# Patient Record
Sex: Male | Born: 1975 | Race: Black or African American | Hispanic: No | Marital: Married | Smoking: Current some day smoker
Health system: Southern US, Community
[De-identification: ages and names within clinical notes are randomized; demographics above are authoritative.]

## PROBLEM LIST (undated history)

## (undated) DIAGNOSIS — K219 Gastro-esophageal reflux disease without esophagitis: Secondary | ICD-10-CM

## (undated) DIAGNOSIS — N189 Chronic kidney disease, unspecified: Secondary | ICD-10-CM

## (undated) DIAGNOSIS — I10 Essential (primary) hypertension: Secondary | ICD-10-CM

## (undated) DIAGNOSIS — Z8639 Personal history of other endocrine, nutritional and metabolic disease: Secondary | ICD-10-CM

## (undated) DIAGNOSIS — Z923 Personal history of irradiation: Secondary | ICD-10-CM

## (undated) DIAGNOSIS — R011 Cardiac murmur, unspecified: Secondary | ICD-10-CM

## (undated) DIAGNOSIS — Z9189 Other specified personal risk factors, not elsewhere classified: Secondary | ICD-10-CM

## (undated) DIAGNOSIS — E079 Disorder of thyroid, unspecified: Secondary | ICD-10-CM

## (undated) DIAGNOSIS — N5089 Other specified disorders of the male genital organs: Secondary | ICD-10-CM

## (undated) HISTORY — DX: Essential (primary) hypertension: I10

## (undated) HISTORY — DX: Cardiac murmur, unspecified: R01.1

## (undated) HISTORY — DX: Disorder of thyroid, unspecified: E07.9

## (undated) HISTORY — PX: TONSILLECTOMY: SUR1361

## (undated) HISTORY — DX: Chronic kidney disease, unspecified: N18.9

---

## 1998-03-06 ENCOUNTER — Encounter: Admission: RE | Admit: 1998-03-06 | Discharge: 1998-03-06 | Payer: Self-pay | Admitting: Family Medicine

## 1998-05-25 ENCOUNTER — Encounter: Admission: RE | Admit: 1998-05-25 | Discharge: 1998-05-25 | Payer: Self-pay | Admitting: Family Medicine

## 1998-12-01 ENCOUNTER — Encounter: Admission: RE | Admit: 1998-12-01 | Discharge: 1998-12-01 | Payer: Self-pay | Admitting: Family Medicine

## 2000-02-11 ENCOUNTER — Encounter: Admission: RE | Admit: 2000-02-11 | Discharge: 2000-02-11 | Payer: Self-pay | Admitting: Sports Medicine

## 2000-12-30 ENCOUNTER — Encounter: Admission: RE | Admit: 2000-12-30 | Discharge: 2000-12-30 | Payer: Self-pay | Admitting: Sports Medicine

## 2001-01-28 ENCOUNTER — Encounter: Admission: RE | Admit: 2001-01-28 | Discharge: 2001-01-28 | Payer: Self-pay | Admitting: Family Medicine

## 2001-02-02 ENCOUNTER — Ambulatory Visit (HOSPITAL_COMMUNITY): Admission: RE | Admit: 2001-02-02 | Discharge: 2001-02-02 | Payer: Self-pay | Admitting: Family Medicine

## 2001-02-03 ENCOUNTER — Encounter: Payer: Self-pay | Admitting: Family Medicine

## 2001-03-09 ENCOUNTER — Encounter: Admission: RE | Admit: 2001-03-09 | Discharge: 2001-03-09 | Payer: Self-pay | Admitting: Family Medicine

## 2001-03-26 ENCOUNTER — Encounter: Admission: RE | Admit: 2001-03-26 | Discharge: 2001-03-26 | Payer: Self-pay | Admitting: Family Medicine

## 2001-04-22 ENCOUNTER — Encounter: Payer: Self-pay | Admitting: Family Medicine

## 2001-04-22 ENCOUNTER — Ambulatory Visit (HOSPITAL_COMMUNITY): Admission: RE | Admit: 2001-04-22 | Discharge: 2001-04-22 | Payer: Self-pay | Admitting: Family Medicine

## 2001-05-01 ENCOUNTER — Encounter: Admission: RE | Admit: 2001-05-01 | Discharge: 2001-05-01 | Payer: Self-pay | Admitting: Family Medicine

## 2001-06-03 ENCOUNTER — Encounter: Admission: RE | Admit: 2001-06-03 | Discharge: 2001-06-03 | Payer: Self-pay | Admitting: Family Medicine

## 2001-09-14 ENCOUNTER — Encounter: Admission: RE | Admit: 2001-09-14 | Discharge: 2001-09-14 | Payer: Self-pay | Admitting: Sports Medicine

## 2002-01-05 ENCOUNTER — Encounter: Admission: RE | Admit: 2002-01-05 | Discharge: 2002-01-05 | Payer: Self-pay | Admitting: Sports Medicine

## 2002-02-17 ENCOUNTER — Encounter: Admission: RE | Admit: 2002-02-17 | Discharge: 2002-02-17 | Payer: Self-pay | Admitting: Family Medicine

## 2002-03-31 ENCOUNTER — Encounter: Admission: RE | Admit: 2002-03-31 | Discharge: 2002-03-31 | Payer: Self-pay | Admitting: Family Medicine

## 2002-05-06 ENCOUNTER — Encounter: Admission: RE | Admit: 2002-05-06 | Discharge: 2002-05-06 | Payer: Self-pay | Admitting: Family Medicine

## 2002-05-17 ENCOUNTER — Encounter: Admission: RE | Admit: 2002-05-17 | Discharge: 2002-05-17 | Payer: Self-pay | Admitting: Sports Medicine

## 2002-05-24 ENCOUNTER — Encounter: Admission: RE | Admit: 2002-05-24 | Discharge: 2002-05-24 | Payer: Self-pay | Admitting: Family Medicine

## 2002-06-23 ENCOUNTER — Encounter: Admission: RE | Admit: 2002-06-23 | Discharge: 2002-06-23 | Payer: Self-pay | Admitting: Family Medicine

## 2002-12-03 ENCOUNTER — Encounter: Admission: RE | Admit: 2002-12-03 | Discharge: 2002-12-03 | Payer: Self-pay | Admitting: Family Medicine

## 2002-12-17 ENCOUNTER — Encounter: Admission: RE | Admit: 2002-12-17 | Discharge: 2002-12-17 | Payer: Self-pay | Admitting: Family Medicine

## 2003-09-28 ENCOUNTER — Encounter: Admission: RE | Admit: 2003-09-28 | Discharge: 2003-09-28 | Payer: Self-pay | Admitting: Family Medicine

## 2003-11-28 ENCOUNTER — Encounter: Admission: RE | Admit: 2003-11-28 | Discharge: 2003-11-28 | Payer: Self-pay | Admitting: Family Medicine

## 2005-03-13 ENCOUNTER — Emergency Department (HOSPITAL_COMMUNITY): Admission: EM | Admit: 2005-03-13 | Discharge: 2005-03-13 | Payer: Self-pay | Admitting: Emergency Medicine

## 2007-01-08 DIAGNOSIS — E0590 Thyrotoxicosis, unspecified without thyrotoxic crisis or storm: Secondary | ICD-10-CM | POA: Insufficient documentation

## 2007-01-08 DIAGNOSIS — I10 Essential (primary) hypertension: Secondary | ICD-10-CM | POA: Insufficient documentation

## 2011-11-12 HISTORY — PX: WISDOM TOOTH EXTRACTION: SHX21

## 2013-09-28 ENCOUNTER — Other Ambulatory Visit (HOSPITAL_COMMUNITY): Payer: Self-pay | Admitting: Urology

## 2013-09-28 DIAGNOSIS — N5082 Scrotal pain: Secondary | ICD-10-CM

## 2013-11-29 ENCOUNTER — Ambulatory Visit (HOSPITAL_COMMUNITY): Payer: 59

## 2014-03-21 ENCOUNTER — Other Ambulatory Visit (HOSPITAL_COMMUNITY): Payer: Self-pay | Admitting: Urology

## 2014-03-21 DIAGNOSIS — N5089 Other specified disorders of the male genital organs: Secondary | ICD-10-CM

## 2014-03-23 ENCOUNTER — Ambulatory Visit (HOSPITAL_COMMUNITY)
Admission: RE | Admit: 2014-03-23 | Discharge: 2014-03-23 | Disposition: A | Discharge status: Home / Self Care | Payer: Managed Care, Other (non HMO) | Source: Ambulatory Visit | Attending: Urology | Admitting: Urology

## 2014-03-23 DIAGNOSIS — I861 Scrotal varices: Secondary | ICD-10-CM | POA: Insufficient documentation

## 2014-03-23 DIAGNOSIS — N5089 Other specified disorders of the male genital organs: Secondary | ICD-10-CM

## 2014-04-05 ENCOUNTER — Other Ambulatory Visit: Payer: Self-pay | Admitting: Urology

## 2014-04-11 ENCOUNTER — Encounter (HOSPITAL_BASED_OUTPATIENT_CLINIC_OR_DEPARTMENT_OTHER): Payer: Self-pay | Admitting: *Deleted

## 2014-04-11 NOTE — Progress Notes (Signed)
NPO AFTER MN. ARRIVE AT 0515 , HEPARIN 2 HRS PRE-OP.  NEEDS ISTAT AND EKG. WILL TAKE NORVASC AM DOS W/ SIPS OF WATER.

## 2014-04-11 NOTE — Progress Notes (Signed)
04/11/14 1128  OBSTRUCTIVE SLEEP APNEA  Have you ever been diagnosed with sleep apnea through a sleep study? No  Do you snore loudly (loud enough to be heard through closed doors)?  1  Do you often feel tired, fatigued, or sleepy during the daytime? 0  Has anyone observed you stop breathing during your sleep? 0  Do you have, or are you being treated for high blood pressure? 1  BMI more than 35 kg/m2? 0  Age over 37 years old? 0  Neck circumference greater than 40 cm/16 inches? 1  Gender: 1  Obstructive Sleep Apnea Score 4  Score 4 or greater  Results sent to PCP

## 2014-04-14 NOTE — H&P (Signed)
Reason For Visit Follow-up for abnormal ultrasound   History of Present Illness 38 year old male who initially presented to me with complaint of a varicocele and low testosterone. His testosterone level was within normal limits. An ultrasound was ordered and showed an abnormal area within the lower pole nephrostomy testicle. This was not organized and not appear to be a tumor, however we did decide to repeat his ultrasound. His testicular cancer markers were negative at that time. He was temporarily lost to follow-up, but did have an ultrasound ultimately 2 weeks ago. He presents today for follow-up in this regard. No new complaints. No changes in his libido,erections, and improvement in his energy level.   Past Medical History Problems  1. History of hypertension (V12.59) 2. History of Hyperthyroidism (242.90)  Surgical History Problems  1. History of Tonsillectomy  Current Meds 1. AmLODIPine Besylate 10 MG Oral Tablet;  Therapy: (Recorded:26Aug2014) to Recorded  Allergies Medication  1. No Known Drug Allergies  Family History Problems  1. Family history of Breast Cancer (V16.3) : Paternal Grandmother 2. Family history of Family Health Status Number Of Children   1 son 2 daughters 3. No pertinent family history : Mother  Social History Problems  1. Alcohol Use   2 per day 2. Caffeine Use   rare 3. Marital History - Currently Married 4. Never A Smoker 5. Occupation:   Quarry managerconstruction inspector  Vitals Vital Signs [Data Includes: Last 1 Day]  Recorded: 26May2015 08:18AM  Height: 5 ft 9 in Weight: 214 lb  BMI Calculated: 31.6 BSA Calculated: 2.13 Blood Pressure: 143 / 87 Heart Rate: 51  Physical Exam left varicocele is present - unchanged  left testicle is atrophied slightly, I am unable to palpate the mass that is apparent on the u/s.  right testicle is normal appearing.   Results/Data Urine [Data Includes: Last 1 Day]   26May2015  COLOR YELLOW    APPEARANCE CLEAR   SPECIFIC GRAVITY 1.020   pH 6.0   GLUCOSE NEG mg/dL  BILIRUBIN NEG   KETONE NEG mg/dL  BLOOD NEG   PROTEIN NEG mg/dL  UROBILINOGEN 0.2 mg/dL  NITRITE NEG   LEUKOCYTE ESTERASE NEG    scrotal u/s: IMPRESSION:  1. Bilateral hydroceles. The area of palpation on the left may  correspond to this finding.  2. A poorly defined hypoechoic nodular region in the left testicle.  A small solid nodule cannot be excluded and urologic consultation is  recommended. These results will be called to the ordering clinician  or representative by the Radiologist Assistant, and communication  documented in the PACS Dashboard   Assessment Assessed  1. Testicular mass (608.89)  Plan Health Maintenance  1. UA With REFLEX; [Do Not Release]; Status:Complete;   Done: 26May2015 08:12AM Testicular mass  2. Follow-up Schedule Surgery Office  Follow-up  Status: Hold For - Appointment   Requested for: 26May2015 3. ALPHA-FETOPROTIEN (TUMOR MARKER); Status:In Progress - Specimen/Data  Collected;   Done: 26May2015 4. BETA HCG TUMOR MARKER; Status:In Progress - Specimen/Data Collected;   Done:  26May2015 5. LDH; Status:In Progress - Specimen/Data Collected;   Done: 26May2015 6. VENIPUNCTURE; Status:Complete;   Done: 26May2015  Discussion/Summary Left testicular ill-defined heterogeneous/hypoechoic area appears to be growing and is also more prominent. I went over the implications of this with the patient. Does appear to have increased in size over the last 5 months. It is unusual appearing, does not appear like a traditional malignancy, however, with the increase in size I think the safest thing is  to remove the left testicle. I have gone over the surgery including the risk/benefits and post-op expectations. I will repeat his tumor markers today and get him scheduled for radical orchiectomy ASAP.

## 2014-04-15 ENCOUNTER — Ambulatory Visit (HOSPITAL_BASED_OUTPATIENT_CLINIC_OR_DEPARTMENT_OTHER): Payer: Managed Care, Other (non HMO) | Admitting: Anesthesiology

## 2014-04-15 ENCOUNTER — Encounter (HOSPITAL_BASED_OUTPATIENT_CLINIC_OR_DEPARTMENT_OTHER): Payer: Managed Care, Other (non HMO) | Admitting: Anesthesiology

## 2014-04-15 ENCOUNTER — Encounter (HOSPITAL_BASED_OUTPATIENT_CLINIC_OR_DEPARTMENT_OTHER): Payer: Self-pay | Admitting: *Deleted

## 2014-04-15 ENCOUNTER — Encounter (HOSPITAL_BASED_OUTPATIENT_CLINIC_OR_DEPARTMENT_OTHER)
Admission: RE | Disposition: A | Discharge status: Home / Self Care | Payer: Self-pay | Source: Ambulatory Visit | Attending: Urology

## 2014-04-15 ENCOUNTER — Ambulatory Visit (HOSPITAL_BASED_OUTPATIENT_CLINIC_OR_DEPARTMENT_OTHER)
Admission: RE | Admit: 2014-04-15 | Discharge: 2014-04-15 | Disposition: A | Discharge status: Home / Self Care | Payer: Managed Care, Other (non HMO) | Source: Ambulatory Visit | Attending: Urology | Admitting: Urology

## 2014-04-15 DIAGNOSIS — N433 Hydrocele, unspecified: Secondary | ICD-10-CM | POA: Insufficient documentation

## 2014-04-15 DIAGNOSIS — E0590 Thyrotoxicosis, unspecified without thyrotoxic crisis or storm: Secondary | ICD-10-CM | POA: Insufficient documentation

## 2014-04-15 DIAGNOSIS — I861 Scrotal varices: Secondary | ICD-10-CM | POA: Insufficient documentation

## 2014-04-15 DIAGNOSIS — F17200 Nicotine dependence, unspecified, uncomplicated: Secondary | ICD-10-CM | POA: Insufficient documentation

## 2014-04-15 DIAGNOSIS — N508 Other specified disorders of male genital organs: Secondary | ICD-10-CM | POA: Insufficient documentation

## 2014-04-15 DIAGNOSIS — N5089 Other specified disorders of the male genital organs: Secondary | ICD-10-CM

## 2014-04-15 DIAGNOSIS — Z803 Family history of malignant neoplasm of breast: Secondary | ICD-10-CM | POA: Insufficient documentation

## 2014-04-15 DIAGNOSIS — I10 Essential (primary) hypertension: Secondary | ICD-10-CM | POA: Insufficient documentation

## 2014-04-15 HISTORY — DX: Other specified personal risk factors, not elsewhere classified: Z91.89

## 2014-04-15 HISTORY — PX: ORCHIECTOMY: SHX2116

## 2014-04-15 HISTORY — DX: Gastro-esophageal reflux disease without esophagitis: K21.9

## 2014-04-15 HISTORY — DX: Personal history of irradiation: Z92.3

## 2014-04-15 HISTORY — DX: Essential (primary) hypertension: I10

## 2014-04-15 HISTORY — DX: Other specified disorders of the male genital organs: N50.89

## 2014-04-15 HISTORY — DX: Personal history of other endocrine, nutritional and metabolic disease: Z86.39

## 2014-04-15 LAB — POCT I-STAT, CHEM 8
BUN: 8 mg/dL (ref 6–23)
CREATININE: 1 mg/dL (ref 0.50–1.35)
Calcium, Ion: 1.28 mmol/L — ABNORMAL HIGH (ref 1.12–1.23)
Chloride: 101 mEq/L (ref 96–112)
Glucose, Bld: 102 mg/dL — ABNORMAL HIGH (ref 70–99)
HCT: 48 % (ref 39.0–52.0)
HEMOGLOBIN: 16.3 g/dL (ref 13.0–17.0)
POTASSIUM: 3.6 meq/L — AB (ref 3.7–5.3)
SODIUM: 144 meq/L (ref 137–147)
TCO2: 25 mmol/L (ref 0–100)

## 2014-04-15 SURGERY — ORCHIECTOMY
Anesthesia: General | Site: Scrotum | Laterality: Left

## 2014-04-15 MED ORDER — LACTATED RINGERS IV SOLN
INTRAVENOUS | Status: DC
  Administered 2014-04-15 (×2): via INTRAVENOUS
  Filled 2014-04-15: qty 1000

## 2014-04-15 MED ORDER — MIDAZOLAM HCL 2 MG/2ML IJ SOLN
INTRAMUSCULAR | Status: AC
  Filled 2014-04-15: qty 2

## 2014-04-15 MED ORDER — 0.9 % SODIUM CHLORIDE (POUR BTL) OPTIME
TOPICAL | Status: DC | PRN
  Administered 2014-04-15: 500 mL

## 2014-04-15 MED ORDER — FENTANYL CITRATE 0.05 MG/ML IJ SOLN
INTRAMUSCULAR | Status: DC | PRN
  Administered 2014-04-15: 100 ug via INTRAVENOUS
  Administered 2014-04-15 (×2): 50 ug via INTRAVENOUS

## 2014-04-15 MED ORDER — ONDANSETRON HCL 4 MG/2ML IJ SOLN
INTRAMUSCULAR | Status: DC | PRN
  Administered 2014-04-15: 4 mg via INTRAVENOUS

## 2014-04-15 MED ORDER — BUPIVACAINE HCL (PF) 0.25 % IJ SOLN
INTRAMUSCULAR | Status: DC | PRN
  Administered 2014-04-15: 26 mL

## 2014-04-15 MED ORDER — HEPARIN SODIUM (PORCINE) 5000 UNIT/ML IJ SOLN
5000.0000 [IU] | Freq: Once | INTRAMUSCULAR | Status: AC
  Administered 2014-04-15: 5000 [IU] via SUBCUTANEOUS
  Filled 2014-04-15: qty 1

## 2014-04-15 MED ORDER — LIDOCAINE HCL (CARDIAC) 20 MG/ML IV SOLN
INTRAVENOUS | Status: DC | PRN
  Administered 2014-04-15: 100 mg via INTRAVENOUS

## 2014-04-15 MED ORDER — LACTATED RINGERS IV SOLN
INTRAVENOUS | Status: DC
  Filled 2014-04-15: qty 1000

## 2014-04-15 MED ORDER — DEXAMETHASONE SODIUM PHOSPHATE 4 MG/ML IJ SOLN
INTRAMUSCULAR | Status: DC | PRN
  Administered 2014-04-15: 10 mg via INTRAVENOUS

## 2014-04-15 MED ORDER — DOCUSATE SODIUM 100 MG PO CAPS: 100.0000 mg | ORAL_CAPSULE | Freq: Two times a day (BID) | ORAL | Status: DC | PRN

## 2014-04-15 MED ORDER — ACETAMINOPHEN 10 MG/ML IV SOLN
INTRAVENOUS | Status: DC | PRN
  Administered 2014-04-15: 1000 mg via INTRAVENOUS

## 2014-04-15 MED ORDER — PROMETHAZINE HCL 25 MG/ML IJ SOLN
6.2500 mg | INTRAMUSCULAR | Status: DC | PRN
  Filled 2014-04-15: qty 1

## 2014-04-15 MED ORDER — KETOROLAC TROMETHAMINE 30 MG/ML IJ SOLN
INTRAMUSCULAR | Status: DC | PRN
  Administered 2014-04-15: 30 mg via INTRAVENOUS

## 2014-04-15 MED ORDER — MEPERIDINE HCL 25 MG/ML IJ SOLN
6.2500 mg | INTRAMUSCULAR | Status: DC | PRN
  Filled 2014-04-15: qty 1

## 2014-04-15 MED ORDER — OXYCODONE HCL 5 MG PO TABS: 5.0000 mg | ORAL_TABLET | ORAL | Status: DC | PRN

## 2014-04-15 MED ORDER — GLYCOPYRROLATE 0.2 MG/ML IJ SOLN
INTRAMUSCULAR | Status: DC | PRN
  Administered 2014-04-15: 0.2 mg via INTRAVENOUS

## 2014-04-15 MED ORDER — FENTANYL CITRATE 0.05 MG/ML IJ SOLN
INTRAMUSCULAR | Status: AC
  Filled 2014-04-15: qty 4

## 2014-04-15 MED ORDER — MIDAZOLAM HCL 5 MG/5ML IJ SOLN
INTRAMUSCULAR | Status: DC | PRN
  Administered 2014-04-15: 2 mg via INTRAVENOUS

## 2014-04-15 MED ORDER — FENTANYL CITRATE 0.05 MG/ML IJ SOLN
25.0000 ug | INTRAMUSCULAR | Status: DC | PRN
  Filled 2014-04-15: qty 1

## 2014-04-15 MED ORDER — CEFAZOLIN SODIUM-DEXTROSE 2-3 GM-% IV SOLR
2.0000 g | Freq: Once | INTRAVENOUS | Status: AC
  Administered 2014-04-15: 2 g via INTRAVENOUS
  Filled 2014-04-15: qty 50

## 2014-04-15 MED ORDER — HEPARIN SODIUM (PORCINE) 5000 UNIT/ML IJ SOLN
INTRAMUSCULAR | Status: AC
  Filled 2014-04-15: qty 1

## 2014-04-15 MED ORDER — PROPOFOL 10 MG/ML IV BOLUS
INTRAVENOUS | Status: DC | PRN
  Administered 2014-04-15: 200 mg via INTRAVENOUS

## 2014-04-15 SURGICAL SUPPLY — 39 items
ADH SKN CLS APL DERMABOND .7 (GAUZE/BANDAGES/DRESSINGS) ×1
BLADE SURG 15 STRL LF DISP TIS (BLADE) ×1 IMPLANT
BLADE SURG 15 STRL SS (BLADE) ×3
BNDG GAUZE ELAST 4 BULKY (GAUZE/BANDAGES/DRESSINGS) ×3 IMPLANT
CANISTER SUCTION 2500CC (MISCELLANEOUS) ×3 IMPLANT
COVER MAYO STAND STRL (DRAPES) ×3 IMPLANT
COVER TABLE BACK 60X90 (DRAPES) ×3 IMPLANT
DERMABOND ADVANCED (GAUZE/BANDAGES/DRESSINGS) ×2
DERMABOND ADVANCED .7 DNX12 (GAUZE/BANDAGES/DRESSINGS) ×1 IMPLANT
DISSECTOR ROUND CHERRY 3/8 STR (MISCELLANEOUS) ×3 IMPLANT
DRAIN PENROSE 18X1/4 LTX STRL (WOUND CARE) ×2 IMPLANT
DRAPE PED LAPAROTOMY (DRAPES) ×3 IMPLANT
ELECT REM PT RETURN 9FT ADLT (ELECTROSURGICAL) ×3
ELECTRODE REM PT RTRN 9FT ADLT (ELECTROSURGICAL) ×1 IMPLANT
GLOVE BIOGEL M STER SZ 6 (GLOVE) ×3 IMPLANT
GLOVE BIOGEL M STRL SZ7.5 (GLOVE) ×2 IMPLANT
GLOVE INDICATOR 6.5 STRL GRN (GLOVE) ×3 IMPLANT
GOWN STRL REUS W/TWL LRG LVL3 (GOWN DISPOSABLE) ×2 IMPLANT
GOWN STRL REUS W/TWL XL LVL3 (GOWN DISPOSABLE) ×3 IMPLANT
NEEDLE HYPO 22GX1.5 SAFETY (NEEDLE) ×3 IMPLANT
NS IRRIG 500ML POUR BTL (IV SOLUTION) ×3 IMPLANT
PACK BASIN DAY SURGERY FS (CUSTOM PROCEDURE TRAY) ×3 IMPLANT
PENCIL BUTTON HOLSTER BLD 10FT (ELECTRODE) ×3 IMPLANT
SPONGE LAP 4X18 X RAY DECT (DISPOSABLE) ×2 IMPLANT
SUPPORT SCROTAL LG STRP (MISCELLANEOUS) ×1 IMPLANT
SUPPORTER ATHLETIC LG (MISCELLANEOUS) ×1
SUT PROLENE 2 0 SH DA (SUTURE) ×2 IMPLANT
SUT SILK 2 0 SH (SUTURE) ×3 IMPLANT
SUT VIC AB 3-0 SH 27 (SUTURE) ×3
SUT VIC AB 3-0 SH 27X BRD (SUTURE) ×1 IMPLANT
SUT VICRYL AB 2 0 TIE (SUTURE) IMPLANT
SUT VICRYL AB 2 0 TIES (SUTURE) ×3
SYR BULB IRRIGATION 50ML (SYRINGE) ×3 IMPLANT
SYR CONTROL 10ML LL (SYRINGE) ×2 IMPLANT
TOWEL OR 17X24 6PK STRL BLUE (TOWEL DISPOSABLE) ×6 IMPLANT
TRAY DSU PREP LF (CUSTOM PROCEDURE TRAY) ×3 IMPLANT
TUBE CONNECTING 12'X1/4 (SUCTIONS) ×1
TUBE CONNECTING 12X1/4 (SUCTIONS) ×2 IMPLANT
YANKAUER SUCT BULB TIP NO VENT (SUCTIONS) ×3 IMPLANT

## 2014-04-15 NOTE — Anesthesia Preprocedure Evaluation (Addendum)
Anesthesia Evaluation  Patient identified by MRN, date of birth, ID band Patient awake    Reviewed: Allergy & Precautions, H&P , NPO status , Patient's Chart, lab work & pertinent test results  Airway Mallampati: II TM Distance: >3 FB Neck ROM: Full    Dental no notable dental hx.    Pulmonary neg pulmonary ROS, Current Smoker,  breath sounds clear to auscultation  Pulmonary exam normal       Cardiovascular hypertension, Pt. on medications Rhythm:Regular Rate:Normal     Neuro/Psych negative neurological ROS  negative psych ROS   GI/Hepatic negative GI ROS, Neg liver ROS,   Endo/Other  negative endocrine ROS  Renal/GU negative Renal ROS  negative genitourinary   Musculoskeletal negative musculoskeletal ROS (+)   Abdominal   Peds negative pediatric ROS (+)  Hematology negative hematology ROS (+)   Anesthesia Other Findings   Reproductive/Obstetrics negative OB ROS                          Anesthesia Physical Anesthesia Plan  ASA: II  Anesthesia Plan: General   Post-op Pain Management:    Induction: Intravenous  Airway Management Planned: LMA  Additional Equipment:   Intra-op Plan:   Post-operative Plan: Extubation in OR  Informed Consent: I have reviewed the patients History and Physical, chart, labs and discussed the procedure including the risks, benefits and alternatives for the proposed anesthesia with the patient or authorized representative who has indicated his/her understanding and acceptance.   Dental advisory given  Plan Discussed with: CRNA  Anesthesia Plan Comments:         Anesthesia Quick Evaluation

## 2014-04-15 NOTE — Op Note (Signed)
Preoperative diagnosis:  1. Left testicular mass  Postoperative diagnosis:   Same  Procedure: 1. Left radical orchiectomy  Surgeon: Crist Fat, MD  Anesthesia: General  Complications: None  Intraoperative findings: Normal anatomy, mass was not palpable.  EBL: Minimal  Specimens: Left spermatic cord and testicle  Indication: Eugene Knight is a 38 y.o. patient with an abnormal scrotal ultrasound which we have been following for several months.  The most recent ultrasound revealed a more discrete mass in the lower pole of the left testicle. Given the concern that this might be a malignancy, I recommended removal of the testicle. After reviewing the management options for treatment, he elected to proceed with the above surgical procedure(s). We have discussed the potential benefits and risks of the procedure, side effects of the proposed treatment, the likelihood of the patient achieving the goals of the procedure, and any potential problems that might occur during the procedure or recuperation. Informed consent has been obtained.  Description of procedure:  The patient was taken to the operating room and general anesthesia was induced.  An LMA was inserted and the patient was prepped and draped in the routine sterile fashion. A timeout was then held with confirmation of antibiotics infusion was performed.  An approximately 3cm incision was then made above the external inguinal ring in the left groin. The incision was then carried down to the external oblique aponeurosis using Bovie cautery. A wheat Lander retractor was then placed within the incision and the assistant used a Army-Navy to help retract distally. The external ring was then opened proximally approximately 1 cm. The spermatic cord was then isolated at the level of the pubic tubercle and surrounded digitally using a Kitner to ensure that all components of the cord were included. I then was able to pass a quarter-inch  Penrose drain underneath the spermatic cord which was and snapped and used for retraction throughout the case. The spermatic cord was then dissected distally into the scrotal hiatus, and then the testicle was pushed from below and brought out into the incision. The gubernacular attachments were then detached from the scrotal wall using electrocautery Bovie. Once the cord and testicle were free the cord was dissected proximally to the internal inguinal ring where it was separated into the vas deferens and the spermatic vessels. Then using snaps the cord was then snapped and divided with Metzenbaum scissors. The vessels were then suture ligated using a 2-0 silk tie. The vas deferens was tied off using a 2-0 Prolene and the end of the suture left long. Once the specimen had been removed the wound bed was copiously irrigated. Hemostasis was noted to be excellent. The external oblique aponeurosis was then reapproximated using a 3-0 Vicryl suture. The deep dermal layer was then approximated using a 3-0 Vicryl in interrupted sutures. The skin was then closed with 4-0 Monocryl in a subcuticular running fashion. The incision was then infiltrated with 20 cc of quarter percent plain Marcaine. Dermabond was then applied. At the end of the case all laps needles and sponges had been accounted for. The patient was subsequently extubated and returned to the PACU in stable condition.  Disposition: The patient is been scheduled for followup in 2 weeks.   Crist Fat, M.D.

## 2014-04-15 NOTE — Anesthesia Procedure Notes (Addendum)
Procedure Name: LMA Insertion Date/Time: 04/15/2014 7:35 AM Performed by: Norva Pavlov Pre-anesthesia Checklist: Patient identified, Emergency Drugs available, Suction available and Patient being monitored Patient Re-evaluated:Patient Re-evaluated prior to inductionOxygen Delivery Method: Circle System Utilized Preoxygenation: Pre-oxygenation with 100% oxygen Intubation Type: IV induction Ventilation: Mask ventilation without difficulty LMA: LMA inserted LMA Size: 5.0 Number of attempts: 1 Airway Equipment and Method: bite block Placement Confirmation: positive ETCO2 Tube secured with: Tape Dental Injury: Teeth and Oropharynx as per pre-operative assessment

## 2014-04-15 NOTE — Discharge Instructions (Signed)
Orchiectomy: POST OP INSTRUCTIONS  1. DIET: Follow a light bland diet the first 24 hours after arrival home, such as soup, liquids, crackers, etc.  Be sure to include lots of fluids daily.  Avoid fast food or heavy meals as your are more likely to get nauseated.  Eat a low fat the next few days after surgery. 2. Take your usually prescribed home medications unless otherwise directed. 3. PAIN CONTROL: a. Pain is best controlled by a usual combination of three different methods TOGETHER: i. Ice/Heat ii. Over the counter pain medication iii. Prescription pain medication b. Most patients will experience some swelling and bruising around the incision.  Ice packs or heating pads (30-60 minutes up to 6 times a day) will help. Use ice for the first few days to help decrease swelling and bruising, then switch to heat to help relax tight/sore spots and speed recovery.  Some people prefer to use ice alone, heat alone, alternating between ice & heat.  Experiment to what works for you.  Swelling and bruising can take several Shaddock to resolve.   c. It is helpful to take an over-the-counter pain medication regularly for the first few Schriver.  Choose one of the following that works best for you: i. Naproxen (Aleve, etc)  Two 220mg  tabs twice a day ii. Ibuprofen (Advil, etc) Three 200mg  tabs four times a day (every meal & bedtime) iii. Acetaminophen (Tylenol, etc) 325-650mg  four times a day (every meal & bedtime) d. A  prescription for pain medication should be given to you upon discharge.  Take your pain medication as prescribed.  i. If you are having problems/concerns with the prescription medicine (does not control pain, nausea, vomiting, rash, itching, etc), please call us [phone] to see if we need to switch you to a different pain medicine that will work better for you and/or control your side effect better. 4. If you need a refill on your pain medication, please contactus. 5. Avoid getting constipated.   Between the surgery and the pain medications, it is common to experience some constipation.  Increasing fluid intake and taking a fiber supplement (such as Metamucil, Citrucel, FiberCon, MiraLax, etc) 1-2 times a day regularly will usually help prevent this problem from occurring.  A mild laxative (prune juice, Milk of Magnesia, MiraLax, etc) should be taken according to package directions if there are no bowel movements after 48 hours.   6. Wash / shower every day.  You may shower over the dressings as they are waterproof.   7. Remove your waterproof bandages 5 days after surgery.  You may leave the incision open to air.  You may replace a dressing/Band-Aid to cover the incision for comfort if you wish.  Continue to shower over incision(s) after the dressing is off. 8. ACTIVITIES as tolerated:   a. You may resume regular (light) daily activities beginning the next day--such as daily self-care, walking, climbing stairs--gradually increasing activities as tolerated.  If you can walk 30 minutes without difficulty, it is safe to try more intense activity such as jogging, treadmill, bicycling, low-impact aerobics, swimming, etc. b. Save the most intensive and strenuous activity for last such as sit-ups, heavy lifting, contact sports, etc  Refrain from any heavy lifting or straining until you are off narcotics for pain control.   c. DO NOT PUSH THROUGH PAIN.  Let pain be your guide: If it hurts to do something, don't do it.  Pain is your body warning you to avoid that activity for another  week until the pain goes down. d. You may drive when you are no longer taking prescription pain medication, you can comfortably wear a seatbelt, and you can safely maneuver your car and apply brakes. e. Bonita Quin may have sexual intercourse when it is comfortable.  9. FOLLOW UP in our office a. Please call [facility] at [phone] to set up an appointment to see your surgeon in the office for a follow-up appointment  approximately 2-3 weeks after your surgery, if you have not been already scheduled for follow-up. b. Make sure that you call for this appointment the day you arrive home to insure a convenient appointment time. 9.  IF YOU HAVE DISABILITY OR FAMILY LEAVE FORMS, BRING THEM TO THE OFFICE FOR PROCESSING.  DO NOT GIVE THEM TO YOUR DOCTOR.  WHEN TO CALL us [phone]: 1. Poor pain control 2. Reactions / problems with new medications (rash/itching, nausea, etc)  3. Fever over 101.5 F (38.5 C) 4. Inability to urinate 5. Nausea and/or vomiting 6. Worsening swelling or bruising 7. Continued bleeding from incision. 8. Increased pain, redness, or drainage from the incision   The clinic staff is available to answer your questions during regular business hours (8:30am-5pm).  Please dont hesitate to call and ask to speak to one of our nurses for clinical concerns.   If you have a medical emergency, go to the nearest emergency room or call 911.    Post Anesthesia Home Care Instructions  Activity: Get plenty of rest for the remainder of the day. A responsible adult should stay with you for 24 hours following the procedure.  For the next 24 hours, DO NOT: -Drive a car -Advertising copywriter -Drink alcoholic beverages -Take any medication unless instructed by your physician -Make any legal decisions or sign important papers.  Meals: Start with liquid foods such as gelatin or soup. Progress to regular foods as tolerated. Avoid greasy, spicy, heavy foods. If nausea and/or vomiting occur, drink only clear liquids until the nausea and/or vomiting subsides. Call your physician if vomiting continues.  Special Instructions/Symptoms: Your throat may feel dry or sore from the anesthesia or the breathing tube placed in your throat during surgery. If this causes discomfort, gargle with warm salt water. The discomfort should disappear within 24 hours.

## 2014-04-15 NOTE — Anesthesia Postprocedure Evaluation (Signed)
  Anesthesia Post-op Note  Patient: Eugene Knight  Procedure(s) Performed: Procedure(s) (LRB): ORCHIECTOMY (Left)  Patient Location: PACU  Anesthesia Type: General  Level of Consciousness: awake and alert   Airway and Oxygen Therapy: Patient Spontanous Breathing  Post-op Pain: mild  Post-op Assessment: Post-op Vital signs reviewed, Patient's Cardiovascular Status Stable, Respiratory Function Stable, Patent Airway and No signs of Nausea or vomiting  Last Vitals:  Filed Vitals:   04/15/14 1105  BP: 135/80  Pulse:   Temp: 36.1 C  Resp: 18    Post-op Vital Signs: stable   Complications: No apparent anesthesia complications

## 2014-04-15 NOTE — Transfer of Care (Signed)
Immediate Anesthesia Transfer of Care Note  Patient: Eugene Knight  Procedure(s) Performed: Procedure(s) (LRB): ORCHIECTOMY (Left)  Patient Location: PACU  Anesthesia Type: General  Level of Consciousness: awake, alert  and oriented  Airway & Oxygen Therapy: Patient Spontanous Breathing and Patient connected to face mask oxygen  Post-op Assessment: Report given to PACU RN and Post -op Vital signs reviewed and stable  Post vital signs: Reviewed and stable  Complications: No apparent anesthesia complications

## 2014-04-20 ENCOUNTER — Encounter (HOSPITAL_BASED_OUTPATIENT_CLINIC_OR_DEPARTMENT_OTHER): Payer: Self-pay | Admitting: Urology

## 2015-11-09 IMAGING — US US SCROTUM
1 series · 13 of 25 positions shown · non-contrast
Comparison: None.

CLINICAL DATA: left lower pole abnormality

EXAM:
ULTRASOUND OF SCROTUM
TECHNIQUE: Complete ultrasound examination of the testicles, epididymis, and
other scrotal structures was performed.

[Series 1: us scrotum · 0.06mm/px · 13 of 45 slices shown]
[im 1/45]
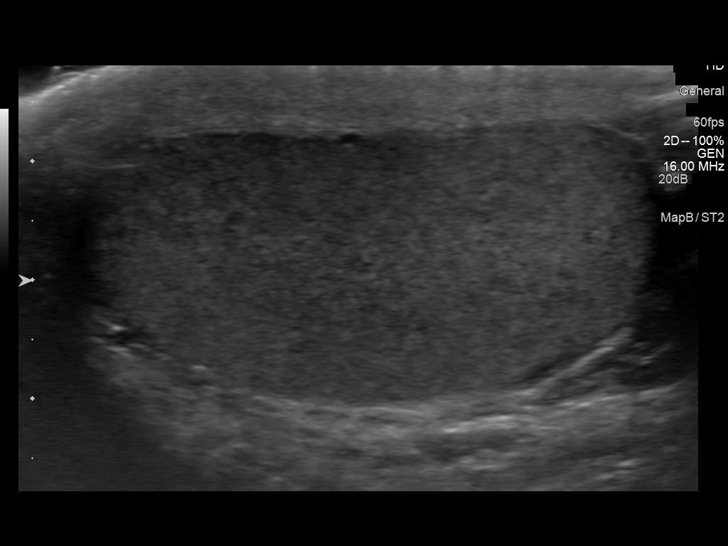
[im 4/45]
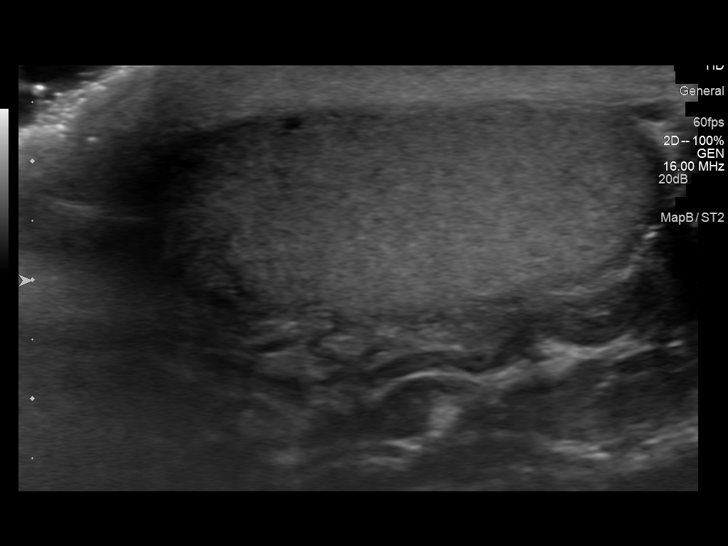
[im 8/45]
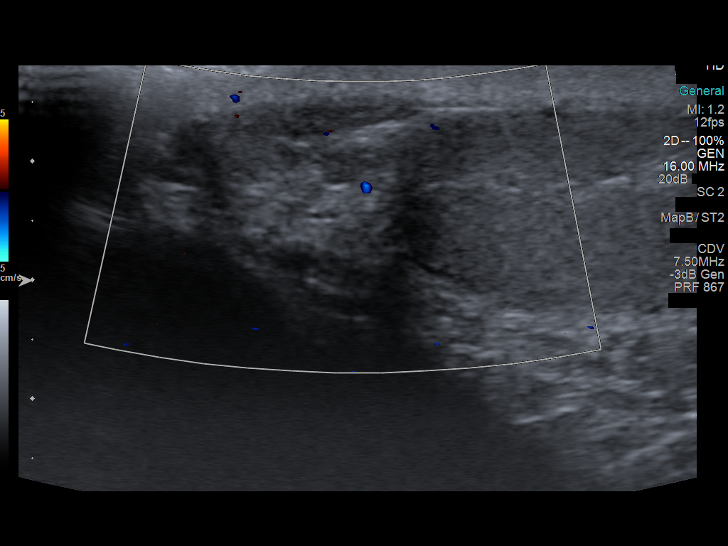
[im 12/45]
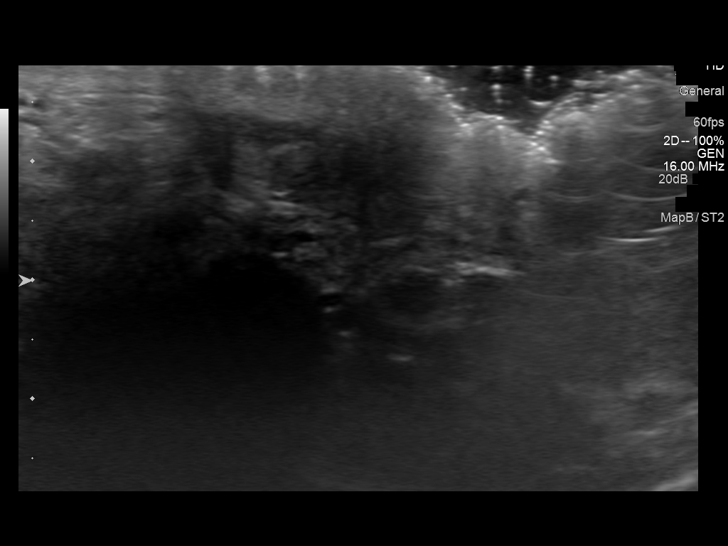
[im 15/45]
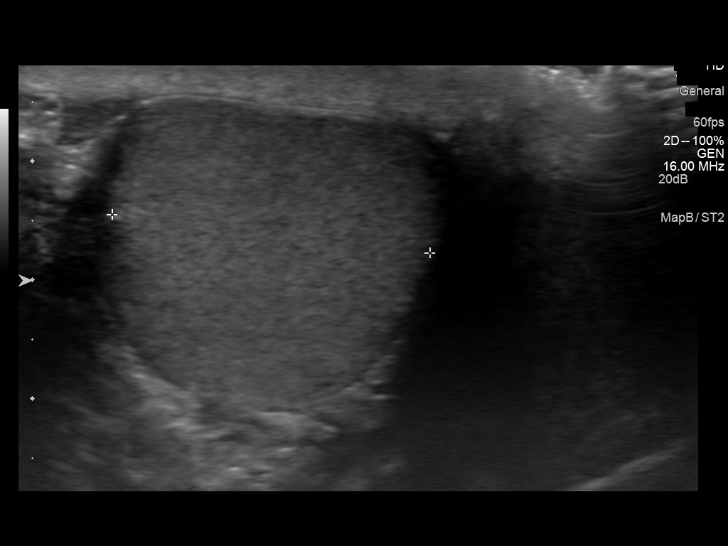
[im 19/45]
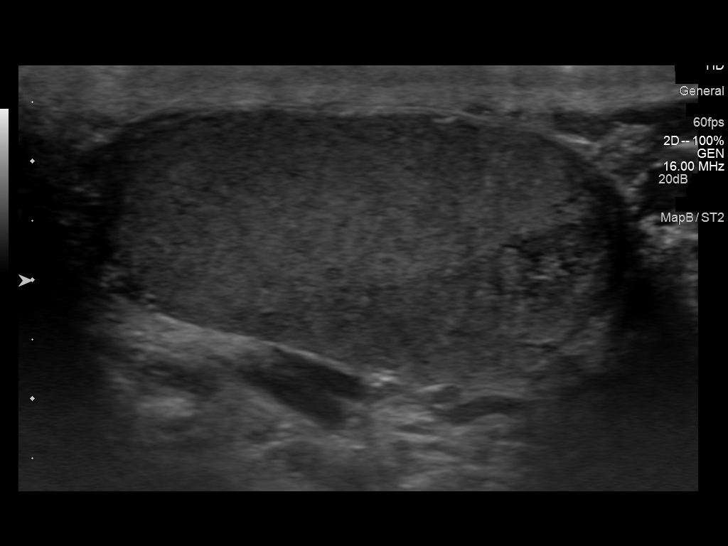
[im 23/45]
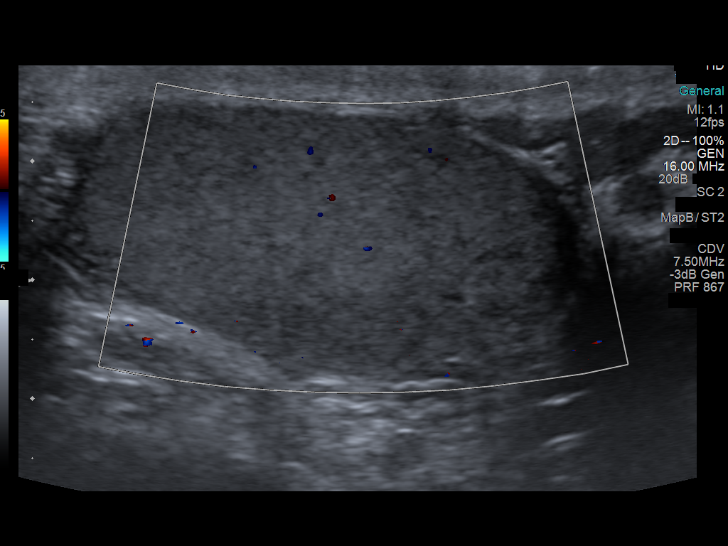
[im 26/45]
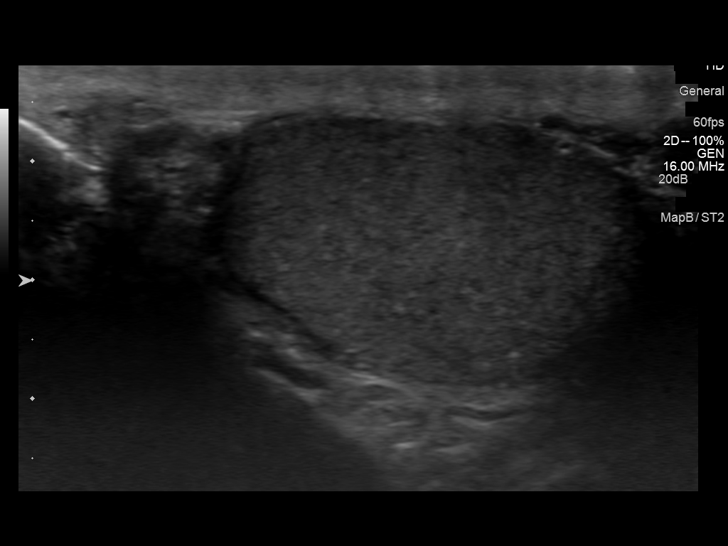
[im 30/45]
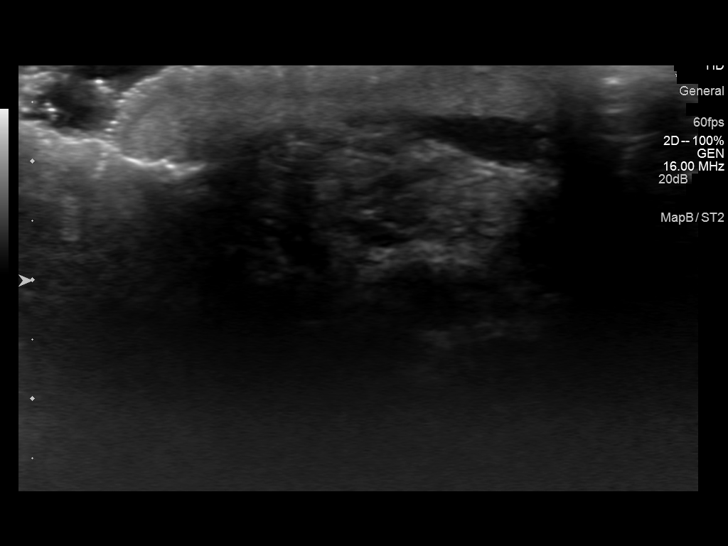
[im 34/45]
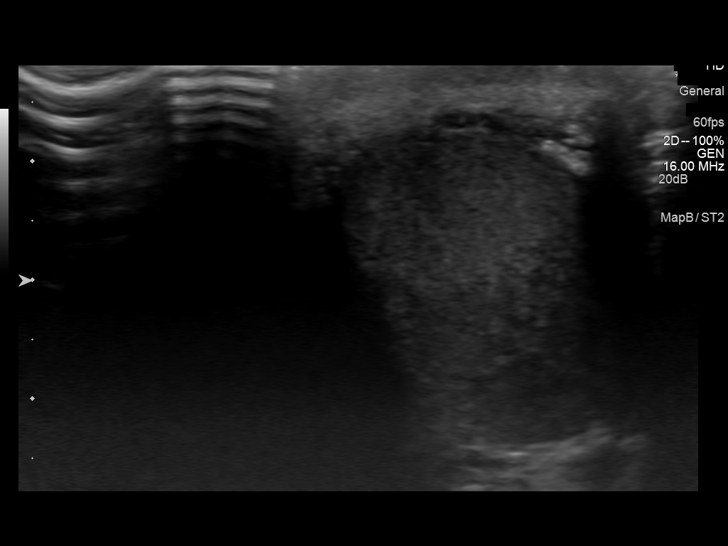
[im 37/45]
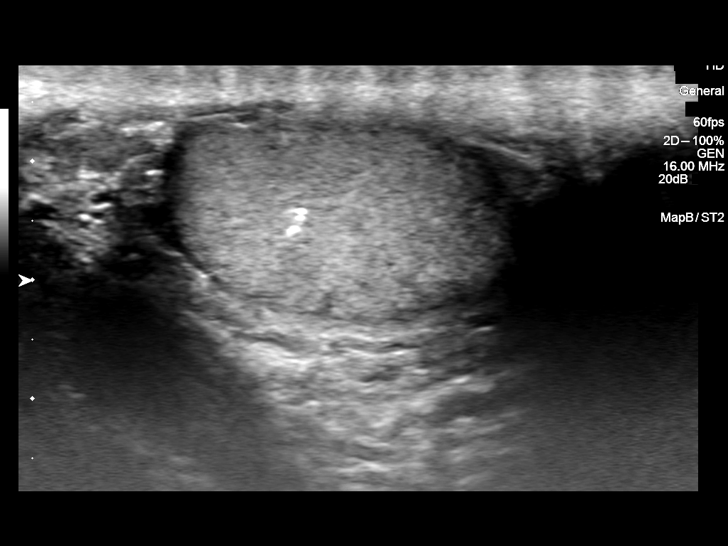
[im 41/45]
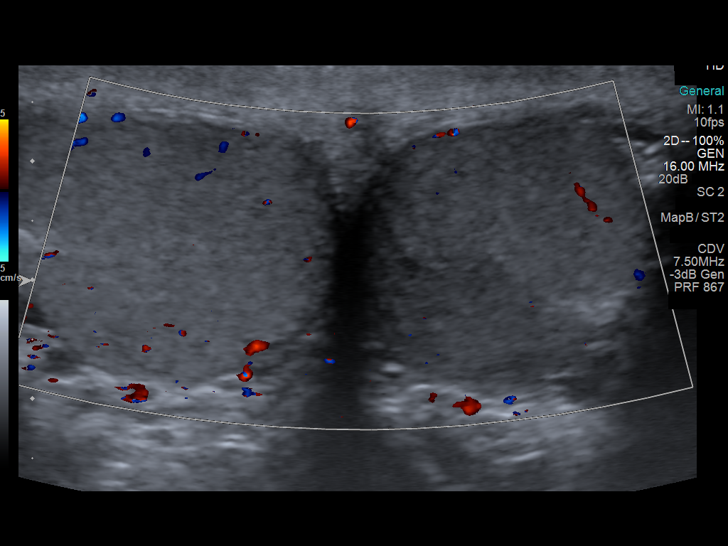
[im 45/45]
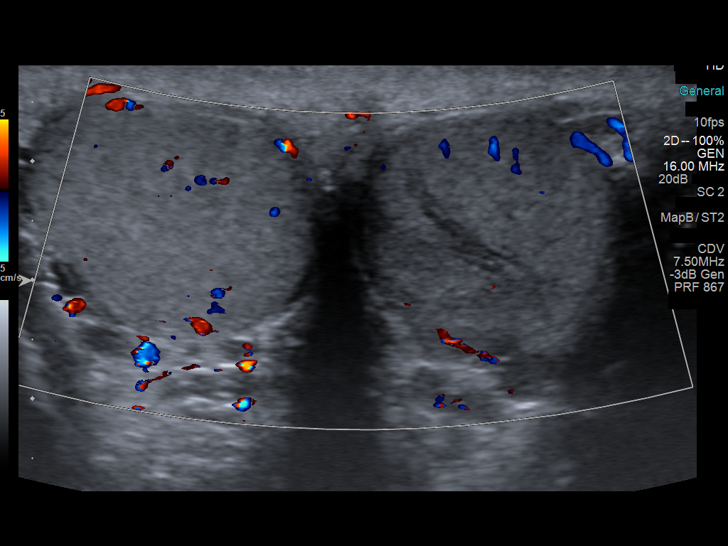

[13 of 25 positions shown; findings below may reference images not displayed]

FINDINGS: Right testicle

Measurements: 4.6 x 2.3 x 2.7 cm. No mass appreciated. A benign
appearing focus of microlithiasis is identified.

Left testicle

Measurements: 4.2 x 2.2 x 2.6 cm. A focal irregular bordered
hypoechoic region within the inferior border of the left testicle.
This area grossly measures 2.1 x 1.2 cm. A focal area of
benign-appearing microlithiasis is also appreciated.

Right epididymis:  Normal in size and appearance.

Left epididymis:  Normal in size and appearance.

Hydrocele:  None visualized.

Varicocele:  Bilateral varicoceles superiorly and inferiorly.
IMPRESSION: 1. Bilateral hydroceles. The area of palpation on the left may
correspond to this finding.
2. A poorly defined hypoechoic nodular region in the left testicle.
A small solid nodule cannot be excluded and urologic consultation is
recommended. These results will be called to the ordering clinician
or representative by the Radiologist Assistant, and communication
documented in the PACS Dashboard.

## 2020-10-23 DIAGNOSIS — M205X1 Other deformities of toe(s) (acquired), right foot: Secondary | ICD-10-CM | POA: Insufficient documentation

## 2020-10-23 DIAGNOSIS — L84 Corns and callosities: Secondary | ICD-10-CM | POA: Insufficient documentation

## 2022-04-16 ENCOUNTER — Ambulatory Visit (HOSPITAL_BASED_OUTPATIENT_CLINIC_OR_DEPARTMENT_OTHER): Payer: Self-pay | Admitting: Nurse Practitioner

## 2022-05-06 DIAGNOSIS — M2141 Flat foot [pes planus] (acquired), right foot: Secondary | ICD-10-CM | POA: Insufficient documentation

## 2022-05-21 ENCOUNTER — Encounter (HOSPITAL_BASED_OUTPATIENT_CLINIC_OR_DEPARTMENT_OTHER): Payer: Self-pay | Admitting: Family Medicine

## 2022-05-21 ENCOUNTER — Ambulatory Visit (INDEPENDENT_AMBULATORY_CARE_PROVIDER_SITE_OTHER): Payer: Managed Care, Other (non HMO) | Admitting: Family Medicine

## 2022-05-21 VITALS — BP 169/114 | HR 60 | Temp 97.6°F | Ht 69.0 in | Wt 249.8 lb

## 2022-05-21 DIAGNOSIS — M76822 Posterior tibial tendinitis, left leg: Secondary | ICD-10-CM

## 2022-05-21 DIAGNOSIS — M25572 Pain in left ankle and joints of left foot: Secondary | ICD-10-CM | POA: Insufficient documentation

## 2022-05-21 NOTE — Progress Notes (Signed)
    Procedures performed today:    None.  Independent interpretation of notes and tests performed by another provider:   None.  Brief History, Exam, Impression, and Recommendations:    BP (!) 169/114   Pulse 60   Temp 97.6 F (36.4 C) (Oral)   Ht 5\' 9"  (1.753 m)   Wt 249 lb 12.8 oz (113.3 kg)   SpO2 98%   BMI 36.89 kg/m   Left ankle pain Pain mostly over anterior and medial ankle. Has been present for about 2 months, no specific injury, does not recall any inciting event. Pain worse with certain activities, walking sometimes, but will have some days without symptoms. Has had some swelling. Has done some icing/heat, no significant benefit with this. Tried home ankle brace/support. Did see podiatry, had X-rays of feet which were unrevealing, was also prescribed meloxicam he thinks. Thinks he may have had some ankle sprains in the past, no fractures or more severe injuries recalled. On exam, patient does have some tenderness to palpation along posterior tibial tendon beginning just proximal to medial malleolus and extending inferiorly down to site of insertion.  To a lesser degree, there is some tenderness over anterior ankle, predominantly over tendon of tibialis anterior.  He does have normal active and passive range of motion of left ankle.  Good strength with plantarflexion, dorsiflexion, eversion and inversion.  Negative anterior drawer.  On visual inspection with weightbearing, he does have notable pes planus, able to perform bilateral calf raises without issue.  With single calf raise on left, he does have some hesitancy, some pain associated with this. I feel that current symptoms are most likely related to tendinitis of posterior tibial tendon.  I feel that this is likely related to underlying notable pes planus which is also further exacerbating symptoms for him currently.  Given symptoms and likely etiology, would recommend topical therapies such as icing, use of OTC medications as  needed or if using meloxicam would avoid OTC NSAIDs.  Orthotics which have been discussed previously during his podiatry evaluation would likely be of benefit for patient as they can reduce some of the stress across the posterior tibial tendon by providing some arch support Also feel that working with physical therapy would be reasonable as they can work on soft tissue mobilization, stretching and strengthening at site of discomfort and along posterior tibial tendon.  Referral placed today Would recommend following up in about 6 to 8 weeks to monitor progress or sooner as needed.  Patient elected to call back as needed over this timeframe.  Blood pressure is elevated in office today.  Recommend that he follow-up with his primary care doctor for further monitoring and discussion of any recommended interventions.  Return in about 8 weeks (around 07/16/2022) for Left ankle pain.   ___________________________________________ Kahron Kauth de 09/15/2022, MD, ABFM, [facility]

## 2022-05-24 NOTE — Assessment & Plan Note (Addendum)
Pain mostly over anterior and medial ankle. Has been present for about 2 months, no specific injury, does not recall any inciting event. Pain worse with certain activities, walking sometimes, but will have some days without symptoms. Has had some swelling. Has done some icing/heat, no significant benefit with this. Tried home ankle brace/support. Did see podiatry, had X-rays of feet which were unrevealing, was also prescribed meloxicam he thinks. Thinks he may have had some ankle sprains in the past, no fractures or more severe injuries recalled. On exam, patient does have some tenderness to palpation along posterior tibial tendon beginning just proximal to medial malleolus and extending inferiorly down to site of insertion.  To a lesser degree, there is some tenderness over anterior ankle, predominantly over tendon of tibialis anterior.  He does have normal active and passive range of motion of left ankle.  Good strength with plantarflexion, dorsiflexion, eversion and inversion.  Negative anterior drawer.  On visual inspection with weightbearing, he does have notable pes planus, able to perform bilateral calf raises without issue.  With single calf raise on left, he does have some hesitancy, some pain associated with this. I feel that current symptoms are most likely related to tendinitis of posterior tibial tendon.  I feel that this is likely related to underlying notable pes planus which is also further exacerbating symptoms for him currently.  Given symptoms and likely etiology, would recommend topical therapies such as icing, use of OTC medications as needed or if using meloxicam would avoid OTC NSAIDs.  Orthotics which have been discussed previously during his podiatry evaluation would likely be of benefit for patient as they can reduce some of the stress across the posterior tibial tendon by providing some arch support Also feel that working with physical therapy would be reasonable as they can work on  soft tissue mobilization, stretching and strengthening at site of discomfort and along posterior tibial tendon.  Referral placed today Would recommend following up in about 6 to 8 weeks to monitor progress or sooner as needed.

## 2022-07-16 ENCOUNTER — Ambulatory Visit (HOSPITAL_BASED_OUTPATIENT_CLINIC_OR_DEPARTMENT_OTHER): Payer: Managed Care, Other (non HMO) | Admitting: Family Medicine

## 2023-12-23 ENCOUNTER — Encounter (HOSPITAL_BASED_OUTPATIENT_CLINIC_OR_DEPARTMENT_OTHER): Payer: Self-pay | Admitting: Family Medicine

## 2023-12-23 ENCOUNTER — Ambulatory Visit (HOSPITAL_BASED_OUTPATIENT_CLINIC_OR_DEPARTMENT_OTHER): Payer: Managed Care, Other (non HMO) | Admitting: Family Medicine

## 2023-12-23 VITALS — BP 171/100 | HR 60 | Ht 68.0 in | Wt 245.4 lb

## 2023-12-23 DIAGNOSIS — I10 Essential (primary) hypertension: Secondary | ICD-10-CM | POA: Diagnosis not present

## 2023-12-23 DIAGNOSIS — J309 Allergic rhinitis, unspecified: Secondary | ICD-10-CM | POA: Insufficient documentation

## 2023-12-23 DIAGNOSIS — Z1211 Encounter for screening for malignant neoplasm of colon: Secondary | ICD-10-CM

## 2023-12-23 DIAGNOSIS — E7800 Pure hypercholesterolemia, unspecified: Secondary | ICD-10-CM | POA: Insufficient documentation

## 2023-12-23 DIAGNOSIS — R0683 Snoring: Secondary | ICD-10-CM | POA: Insufficient documentation

## 2023-12-23 MED ORDER — VALSARTAN 80 MG PO TABS: 80.0000 mg | ORAL_TABLET | Freq: Every day | ORAL | 1 refills | Status: DC

## 2023-12-23 NOTE — Addendum Note (Signed)
Addended by: Carleene Mains A on: 12/23/2023 02:59 PM   Modules accepted: Orders

## 2023-12-23 NOTE — Patient Instructions (Signed)
  Medication Instructions:  Your physician recommends that you continue on your current medications as directed. Please refer to the Current Medication list given to you today. --If you need a refill on any your medications before your next appointment, please call your pharmacy first. If no refills are authorized on file call the office.-- Lab Work: Your physician has recommended that you have lab work today: 2 weeks from today If you have labs (blood work) drawn today and your tests are completely normal, you will receive your results via MyChart message OR a phone call from our staff.  Please ensure you check your voicemail in the event that you authorized detailed messages to be left on a delegated number. If you have any lab test that is abnormal or we need to change your treatment, we will call you to review the results.  Follow-Up: Your next appointment:   Your physician recommends that you schedule a follow-up appointment in: 4 weeks (around 01/20/2024) for hypertension, med check  with Dr. de Peru  You will receive a text message or e-mail with a link to a survey about your care and experience with [place] today! We would greatly appreciate your feedback!   Thanks for letting us be apart of your health journey!!  Primary Care and Sports Medicine   Dr. Ceasar Mons Peru   We encourage you to activate your patient portal called "MyChart".  Sign up information is provided on this After Visit Summary.  MyChart is used to connect with patients for Virtual Visits (Telemedicine).  Patients are able to view lab/test results, encounter notes, upcoming appointments, etc.  Non-urgent messages can be sent to your provider as well. To learn more about what you can do with MyChart, please visit --  [email or web address].

## 2023-12-23 NOTE — Assessment & Plan Note (Signed)
Blood pressure elevated in office today, remains elevated on recheck.  We discussed options, we will continue with amlodipine at current dose and we will add low-dose of valsartan.  Cautioned on potential side effects. Patient will return in about 2 weeks to have labs completed Will plan to follow-up in about 4 to 6 weeks to monitor progress with blood pressure or sooner as needed Recommend intermittent monitoring of blood pressure at home, DASH diet

## 2023-12-23 NOTE — Progress Notes (Signed)
New Patient Office Visit  Subjective   Patient ID: Eugene Knight, male    DOB: 04-23-1976  Age: 48 y.o. MRN: [id number]  CC:  Chief Complaint  Patient presents with   Establish Care    Establish care - high blood pressure with medication not controlled     HPI Bernerd Limbo presents to establish care Last PCP - Deboraha Sprang, Dr. Clovis Riley  HTN: currently taking amlodipine, tolerating well. Does not check BP at home.  Was on hydrochlorothiazide at one point in the past, stopped due to sexual side effects. Denies any chest pain, HA.  FH of HTN  Patient is originally from [place]. Patient works in Conservation officer, historic buildings. He enjoys taking care of home related things.  Outpatient Encounter Medications as of 12/23/2023  Medication Sig   valsartan (DIOVAN) 80 MG tablet Take 1 tablet (80 mg total) by mouth daily.   amLODipine (NORVASC) 10 MG tablet Take 10 mg by mouth every morning.   No facility-administered encounter medications on file as of 12/23/2023.    Past Medical History:  Diagnosis Date   At risk for sleep apnea HAS SLEEP STUDY AS CHILD--  S/P  TONSILLECTOMY   STOP-BANG= 4   SENT TO PCP  04-11-2014   GERD (gastroesophageal reflux disease)    History of hyperthyroidism    APPROX.  2003  (>10 YRS AGO)  S/P RADIACTIVE IODINE TX   Hypertension    S/P radioactive iodine thyroid ablation    2003   Testis mass    LEFT    Past Surgical History:  Procedure Laterality Date   ORCHIECTOMY Left 04/15/2014   Procedure: ORCHIECTOMY;  Surgeon: Crist Fat, MD;  Location: [facility];  Service: Urology;  Laterality: Left;   TONSILLECTOMY  AS CHILD   WISDOM TOOTH EXTRACTION  2013    History reviewed. No pertinent family history.  Social History   Socioeconomic History   Marital status: Married    Spouse name: Not on file   Number of children: Not on file   Years of education: Not on file   Highest education level: Not on file  Occupational History    Not on file  Tobacco Use   Smoking status: Some Days    Types: Cigars   Smokeless tobacco: Never   Tobacco comments:    OCCASIONAL CIGAR  Vaping Use   Vaping status: Never Used  Substance and Sexual Activity   Alcohol use: Yes    Comment: OCCASIONAL   Drug use: No   Sexual activity: Not on file  Other Topics Concern   Not on file  Social History Narrative   Not on file   Social Drivers of Health   Financial Resource Strain: Not on file  Food Insecurity: Not on file  Transportation Needs: Not on file  Physical Activity: Not on file  Stress: Not on file  Social Connections: Not on file  Intimate Partner Violence: Not on file    Objective   BP (!) 171/100 (BP Location: Left Arm, Patient Position: Sitting, Cuff Size: Large)   Pulse 60   Ht 5\' 8"  (1.727 m)   Wt 245 lb 6.4 oz (111.3 kg)   SpO2 100%   BMI 37.31 kg/m   Physical Exam  48 year old male in no acute distress Cardiovascular exam with regular rate and rhythm, no murmur appreciated Lungs clear to auscultation bilaterally  Assessment & Plan:   Primary hypertension Assessment & Plan: Blood pressure elevated in office today, remains  elevated on recheck.  We discussed options, we will continue with amlodipine at current dose and we will add low-dose of valsartan.  Cautioned on potential side effects. Patient will return in about 2 weeks to have labs completed Will plan to follow-up in about 4 to 6 weeks to monitor progress with blood pressure or sooner as needed Recommend intermittent monitoring of blood pressure at home, DASH diet  Orders: -     Basic metabolic panel; Future  Other orders -     Valsartan; Take 1 tablet (80 mg total) by mouth daily.  Dispense: 90 tablet; Refill: 1  We will request records from prior PCP  Return in about 4 weeks (around 01/20/2024) for hypertension, med check.   Spent 32 minutes on this patient encounter, including preparation, chart review, face-to-face counseling with  patient and coordination of care, and documentation of encounter   ___________________________________________ Ajanay Farve de [place], MD, ABFM, [facility] Primary Care and Sports Medicine [facility]

## 2024-01-27 ENCOUNTER — Ambulatory Visit (INDEPENDENT_AMBULATORY_CARE_PROVIDER_SITE_OTHER): Payer: Managed Care, Other (non HMO) | Admitting: Family Medicine

## 2024-01-27 ENCOUNTER — Encounter (HOSPITAL_BASED_OUTPATIENT_CLINIC_OR_DEPARTMENT_OTHER): Payer: Self-pay | Admitting: Family Medicine

## 2024-01-27 VITALS — BP 162/105 | HR 78 | Ht 69.0 in | Wt 242.0 lb

## 2024-01-27 DIAGNOSIS — I10 Essential (primary) hypertension: Secondary | ICD-10-CM

## 2024-01-27 NOTE — Patient Instructions (Signed)
   Medication Instructions:  Your physician recommends that you continue on your current medications as directed. Please refer to the Current Medication list given to you today. --If you need a refill on any your medications before your next appointment, please call your pharmacy first. If no refills are authorized on file call the office.-- Lab Work: Your physician has recommended that you have lab work today: today If you have labs (blood work) drawn today and your tests are completely normal, you will receive your results via MyChart message OR a phone call from our staff.  Please ensure you check your voicemail in the event that you authorized detailed messages to be left on a delegated number. If you have any lab test that is abnormal or we need to change your treatment, we will call you to review the results.    Follow-Up: Your next appointment:   Your physician recommends that you schedule a follow-up appointment in: 4-6 week follow up  with Dr. de Peru  You will receive a text message or e-mail with a link to a survey about your care and experience with [place] today! We would greatly appreciate your feedback!   Thanks for letting us be apart of your health journey!!  Primary Care and Sports Medicine   Dr. Ceasar Mons Peru   We encourage you to activate your patient portal called "MyChart".  Sign up information is provided on this After Visit Summary.  MyChart is used to connect with patients for Virtual Visits (Telemedicine).  Patients are able to view lab/test results, encounter notes, upcoming appointments, etc.  Non-urgent messages can be sent to your provider as well. To learn more about what you can do with MyChart, please visit --  [email or web address].

## 2024-01-27 NOTE — Progress Notes (Signed)
    Procedures performed today:    None.  Independent interpretation of notes and tests performed by another provider:   None.  Brief History, Exam, Impression, and Recommendations:    BP (!) 162/105 (BP Location: Left Arm, Patient Position: Sitting, Cuff Size: Normal)   Pulse 78   Ht 5\' 9"  (1.753 m)   Wt 242 lb (109.8 kg)   SpO2 97%   BMI 35.74 kg/m   Primary hypertension Assessment & Plan: At last visit, we did add low-dose of valsartan to current amlodipine.  Patient reports that he has been doing well with medication, has not noted any side effects.  Home blood pressure readings still running high.  Blood pressure in office today is improved compared to last office visit, still higher than goal. He has not had labs as of yet since starting new medication, he will have these done today.  If labs are reassuring, we will plan to increase dose of valsartan to 160 mg to better control blood pressure.  Recommend intermittent monitoring of blood pressure home, DASH diet, handout provided  Orders: -     Basic metabolic panel  Return in about 6 weeks (around 03/09/2024) for hypertension.   ___________________________________________ Lovelle Lema de [place], MD, ABFM, CAQSM Primary Care and Sports Medicine [facility]

## 2024-01-27 NOTE — Assessment & Plan Note (Signed)
 At last visit, we did add low-dose of valsartan to current amlodipine.  Patient reports that he has been doing well with medication, has not noted any side effects.  Home blood pressure readings still running high.  Blood pressure in office today is improved compared to last office visit, still higher than goal. He has not had labs as of yet since starting new medication, he will have these done today.  If labs are reassuring, we will plan to increase dose of valsartan to 160 mg to better control blood pressure.  Recommend intermittent monitoring of blood pressure home, DASH diet, handout provided

## 2024-01-28 ENCOUNTER — Encounter (HOSPITAL_BASED_OUTPATIENT_CLINIC_OR_DEPARTMENT_OTHER): Payer: Self-pay | Admitting: Family Medicine

## 2024-01-28 LAB — BASIC METABOLIC PANEL
BUN/Creatinine Ratio: 12 (ref 9–20)
BUN: 13 mg/dL (ref 6–24)
CO2: 23 mmol/L (ref 20–29)
Calcium: 9.7 mg/dL (ref 8.7–10.2)
Chloride: 101 mmol/L (ref 96–106)
Creatinine, Ser: 1.07 mg/dL (ref 0.76–1.27)
Glucose: 126 mg/dL — ABNORMAL HIGH (ref 70–99)
Potassium: 4.6 mmol/L (ref 3.5–5.2)
Sodium: 138 mmol/L (ref 134–144)
eGFR: 87 mL/min/{1.73_m2} (ref >59)

## 2024-02-02 ENCOUNTER — Encounter: Payer: Self-pay | Admitting: Pediatrics

## 2024-03-02 ENCOUNTER — Ambulatory Visit (AMBULATORY_SURGERY_CENTER): Admitting: *Deleted

## 2024-03-02 VITALS — Ht 69.0 in | Wt 240.0 lb

## 2024-03-02 DIAGNOSIS — Z1211 Encounter for screening for malignant neoplasm of colon: Secondary | ICD-10-CM

## 2024-03-02 MED ORDER — NA SULFATE-K SULFATE-MG SULF 17.5-3.13-1.6 GM/177ML PO SOLN: 1.0000 | Freq: Once | ORAL | 0 refills | Status: AC

## 2024-03-02 NOTE — Progress Notes (Signed)
 Pt's name and DOB verified at the beginning of the pre-visit wit 2 identifiers  Pt denies any difficulty with ambulating,sitting, laying down or rolling side to side  Pt has no issues with ambulation   Pt has no issues moving head neck or swallowing  No egg or soy allergy known to patient   No issues known to pt with past sedation with any surgeries or procedures  Pt denies having issues being intubated   No FH of Malignant Hyperthermia  Pt is not on diet pills or shots  Pt is not on home 02   Pt is not on blood thinners   Pt denies issues with constipation   Pt is not on dialysis  Pt denise any abnormal heart rhythms  As a child had heart murmur  Pt denies any upcoming cardiac testing  Patient's chart reviewed by Rogena Class CNRA prior to pre-visit and patient appropriate for the [facility].  Pre-visit completed and red dot placed by patient's name on their procedure day (on provider's schedule).    Chart not reviewed by CRNA prior to [facility]  Visit in person  Pt states weight is 240 LB  IInstructions reviewed. Pt given , [facility] main # and MD on call # prior to instructions.  Pt states understanding of instructions. Instructed to review again prior to procedure. Pt states they will.  Instructions given to pt

## 2024-03-09 ENCOUNTER — Encounter: Payer: Self-pay | Admitting: Pediatrics

## 2024-03-14 ENCOUNTER — Encounter: Payer: Self-pay | Admitting: Pediatrics

## 2024-03-17 NOTE — Progress Notes (Signed)
 [facility] History and Physical   Primary Care Physician:  de [place], Alonza Jansky, MD   Reason for Procedure:  Colorectal cancer screening  Plan:    Screening colonoscopy     HPI: Eugene Knight is a 48 y.o. male undergoing screening colonoscopy for colorectal cancer screening.  This is the patient's first colonoscopy.  There is a reported history of colorectal cancer in his maternal uncle.  Patient denies current symptoms of change in bowel habits or rectal bleeding.   Past Medical History:  Diagnosis Date   At risk for sleep apnea HAS SLEEP STUDY AS CHILD--  S/P  TONSILLECTOMY   STOP-BANG= 4   SENT TO PCP  04-11-2014   Chronic kidney disease    Stones   GERD (gastroesophageal reflux disease)    Heart murmur    As a child   History of hyperthyroidism    APPROX.  2003  (>10 YRS AGO)  S/P RADIACTIVE IODINE TX   HTN (hypertension)    Hypertension    S/P radioactive iodine thyroid  ablation    2003   Testis mass    LEFT   Thyroid  disease    PT STATES ISSUE RESOLVED    Past Surgical History:  Procedure Laterality Date   ORCHIECTOMY Left 04/15/2014   Procedure: ORCHIECTOMY;  Surgeon: Andrez Banker, MD;  Location: [facility];  Service: Urology;  Laterality: Left;   TONSILLECTOMY  AS CHILD   WISDOM TOOTH EXTRACTION  2013    Prior to Admission medications   Medication Sig Start Date End Date Taking? Authorizing Provider  amLODipine (NORVASC) 10 MG tablet Take 10 mg by mouth every morning.    [provider]  valsartan  (DIOVAN ) 80 MG tablet Take 1 tablet (80 mg total) by mouth daily. 12/23/23   de Peru, Raymond J, MD    Current Outpatient Medications  Medication Sig Dispense Refill   amLODipine (NORVASC) 10 MG tablet Take 10 mg by mouth every morning.     valsartan  (DIOVAN ) 80 MG tablet Take 1 tablet (80 mg total) by mouth daily. 90 tablet 1   Current Facility-Administered Medications  Medication Dose Route Frequency Provider Last  Rate Last Admin   0.9 %  sodium chloride  infusion  500 mL Intravenous Once Glorian Mcdonell, Scarlette Currier, MD        Allergies as of 03/19/2024   (No Known Allergies)    Family History  Problem Relation Age of Onset   Colon cancer Maternal Uncle    Colon polyps Neg Hx    Esophageal cancer Neg Hx    Rectal cancer Neg Hx    Stomach cancer Neg Hx     Social History   Socioeconomic History   Marital status: Married    Spouse name: Not on file   Number of children: Not on file   Years of education: Not on file   Highest education level: Not on file  Occupational History   Not on file  Tobacco Use   Smoking status: Some Days    Types: Cigars    Passive exposure: Current   Smokeless tobacco: Never   Tobacco comments:    OCCASIONAL CIGAR  Vaping Use   Vaping status: Never Used  Substance and Sexual Activity   Alcohol use: Yes    Comment: OCCASIONAL   Drug use: No   Sexual activity: Not on file  Other Topics Concern   Not on file  Social History Narrative   Not on file  Social Drivers of Corporate investment banker Strain: Not on file  Food Insecurity: Not on file  Transportation Needs: Not on file  Physical Activity: Not on file  Stress: Not on file  Social Connections: Not on file  Intimate Partner Violence: Not on file    Review of Systems:  All other review of systems negative except as mentioned in the HPI.  Physical Exam: Vital signs BP (!) 166/106   Pulse 68   Temp 98.6 F (37 C)   Resp 15   Ht 5\' 9"  (1.753 m)   Wt 240 lb (108.9 kg)   SpO2 95%   BMI 35.44 kg/m   General:   Alert,  Well-developed, well-nourished, pleasant and cooperative in NAD Airway:  Mallampati 2 Lungs:  Clear throughout to auscultation.   Heart:  Regular rate and rhythm; no murmurs, clicks, rubs,  or gallops. Abdomen:  Soft, nontender and nondistended. Normal bowel sounds.   Neuro/Psych:  Normal mood and affect. A and O x 3  Eugenia Hess, MD [facility]

## 2024-03-19 ENCOUNTER — Ambulatory Visit (HOSPITAL_BASED_OUTPATIENT_CLINIC_OR_DEPARTMENT_OTHER): Admitting: Family Medicine

## 2024-03-19 ENCOUNTER — Encounter: Payer: Self-pay | Admitting: Pediatrics

## 2024-03-19 ENCOUNTER — Ambulatory Visit (AMBULATORY_SURGERY_CENTER): Admitting: Pediatrics

## 2024-03-19 VITALS — BP 128/79 | HR 79 | Temp 98.6°F | Resp 17 | Ht 69.0 in | Wt 240.0 lb

## 2024-03-19 DIAGNOSIS — D128 Benign neoplasm of rectum: Secondary | ICD-10-CM

## 2024-03-19 DIAGNOSIS — D123 Benign neoplasm of transverse colon: Secondary | ICD-10-CM | POA: Diagnosis not present

## 2024-03-19 DIAGNOSIS — K6289 Other specified diseases of anus and rectum: Secondary | ICD-10-CM | POA: Diagnosis not present

## 2024-03-19 DIAGNOSIS — Z1211 Encounter for screening for malignant neoplasm of colon: Secondary | ICD-10-CM | POA: Diagnosis present

## 2024-03-19 DIAGNOSIS — K621 Rectal polyp: Secondary | ICD-10-CM

## 2024-03-19 MED ORDER — SODIUM CHLORIDE 0.9 % IV SOLN: 500.0000 mL | Freq: Once | INTRAVENOUS | Status: DC

## 2024-03-19 NOTE — Op Note (Signed)
 [facility] Patient Name: Eugene Knight Procedure Date: 03/19/2024 8:10 AM MRN: [id number] Endoscopist: Eugenia Hess , MD, [id number] Age: 48 Referring MD:  Date of Birth: 1976-01-11 Gender: Male Account #: [id number] Procedure:                Colonoscopy Indications:              Screening for colorectal malignant neoplasm, This                            is the patient's first colonoscopy, Patient reports                            there may be a family history of colorectal cancer                            in a maternal uncle Medicines:                Monitored Anesthesia Care Procedure:                Pre-Anesthesia Assessment:                           - Prior to the procedure, a History and Physical                            was performed, and patient medications and                            allergies were reviewed. The patient's tolerance of                            previous anesthesia was also reviewed. The risks                            and benefits of the procedure and the sedation                            options and risks were discussed with the patient.                            All questions were answered, and informed consent                            was obtained. Prior Anticoagulants: The patient has                            taken no anticoagulant or antiplatelet agents. ASA                            Grade Assessment: II - A patient with mild systemic                            disease. After reviewing the risks and benefits,  the patient was deemed in satisfactory condition to                            undergo the procedure.                           After obtaining informed consent, the colonoscope                            was passed under direct vision. Throughout the                            procedure, the patient's blood pressure, pulse, and                            oxygen saturations were monitored  continuously. The                            Olympus Scope SN [id number] was introduced through the                            anus and advanced to the cecum, identified by                            appendiceal orifice and ileocecal valve. The                            colonoscopy was performed without difficulty. The                            patient tolerated the procedure well. The quality                            of the bowel preparation was good. The ileocecal                            valve, appendiceal orifice, and rectum were                            photographed. Scope In: 8:27:59 AM Scope Out: 8:43:34 AM Scope Withdrawal Time: 0 hours 11 minutes 22 seconds  Total Procedure Duration: 0 hours 15 minutes 35 seconds  Findings:                 The perianal and digital rectal examinations were                            normal. Pertinent negatives include normal                            sphincter tone and no palpable rectal lesions.                           A 6 mm polyp was found in the transverse colon. The  polyp was sessile. The polyp was removed with a                            cold snare. Resection and retrieval were complete.                           In the mid rectum, there was a localized area of                            abnormal mucosa with an adenomatous appearance. The                            tissue was overlying a fold encompassing 1/3- 1/2                            circumference of the fold. Biopsies were taken with                            a cold forceps for histology.                           The retroflexed view of the distal rectum and anal                            verge was normal and showed no anal or rectal                            abnormalities. Complications:            No immediate complications. Estimated blood loss:                            Minimal. Estimated Blood Loss:     Estimated blood loss was  minimal. Impression:               - One 6 mm polyp in the transverse colon, removed                            with a cold snare. Resected and retrieved.                           - Localized tissue with possible adenomatous                            appearance mucosa in the mid rectum encompassing                            1/3-1/2 of a fold. Biopsied.                           - The distal rectum and anal verge are normal on                            retroflexion view. Recommendation:           -  Discharge patient to home (ambulatory).                           - Await pathology results.                           - Repeat colonoscopy for surveillance based on                            pathology results.                           - The findings and recommendations were discussed                            with the patient's family.                           - Return to referring physician.                           - Patient has a contact number available for                            emergencies. The signs and symptoms of potential                            delayed complications were discussed with the                            patient. Return to normal activities tomorrow.                            Written discharge instructions were provided to the                            patient. Eugenia Hess, MD 03/19/2024 8:50:13 AM This report has been signed electronically.

## 2024-03-19 NOTE — Progress Notes (Signed)
 Report given to PACU, vss

## 2024-03-19 NOTE — Patient Instructions (Addendum)

## 2024-03-19 NOTE — Progress Notes (Signed)
 Called to room to assist during endoscopic procedure.  Patient ID and intended procedure confirmed with present staff. Received instructions for my participation in the procedure from the performing physician.

## 2024-03-19 NOTE — Progress Notes (Signed)
 0825 BP 185/109, Labetalol given IV, MD update, vss

## 2024-03-22 ENCOUNTER — Telehealth: Payer: Self-pay | Admitting: *Deleted

## 2024-03-22 NOTE — Telephone Encounter (Signed)
  Follow up Call-     03/19/2024    7:16 AM  Call back number  Post procedure Call Back phone  # [phone]  Permission to leave phone message Yes     Patient questions:  Do you have a fever, pain , or abdominal swelling? No. Pain Score  0 *  Have you tolerated food without any problems? Yes.    Have you been able to return to your normal activities? Yes.    Do you have any questions about your discharge instructions: Diet   No. Medications  No. Follow up visit  No.  Do you have questions or concerns about your Care? No.  Actions: * If pain score is 4 or above: No action needed, pain <4.

## 2024-03-23 ENCOUNTER — Ambulatory Visit: Payer: Self-pay | Admitting: Pediatrics

## 2024-03-23 LAB — SURGICAL PATHOLOGY

## 2024-03-25 ENCOUNTER — Ambulatory Visit (HOSPITAL_BASED_OUTPATIENT_CLINIC_OR_DEPARTMENT_OTHER): Admitting: Family Medicine

## 2024-03-25 ENCOUNTER — Encounter (HOSPITAL_BASED_OUTPATIENT_CLINIC_OR_DEPARTMENT_OTHER): Payer: Self-pay | Admitting: Family Medicine

## 2024-03-25 VITALS — BP 151/95 | HR 64 | Ht 68.0 in | Wt 243.6 lb

## 2024-03-25 DIAGNOSIS — Z0000 Encounter for general adult medical examination without abnormal findings: Secondary | ICD-10-CM

## 2024-03-25 DIAGNOSIS — I10 Essential (primary) hypertension: Secondary | ICD-10-CM

## 2024-03-25 MED ORDER — VALSARTAN 160 MG PO TABS
160.0000 mg | ORAL_TABLET | Freq: Every day | ORAL | 1 refills | Status: DC
Start: 2024-03-25 — End: 2024-07-14

## 2024-03-25 NOTE — Progress Notes (Signed)
    Procedures performed today:    None.  Independent interpretation of notes and tests performed by another provider:   None.  Brief History, Exam, Impression, and Recommendations:    BP (!) 151/95 (BP Location: Right Arm, Patient Position: Sitting, Cuff Size: Normal)   Pulse 64   Ht 5\' 8"  (1.727 m)   Wt 243 lb 9.6 oz (110.5 kg)   SpO2 99%   BMI 37.04 kg/m   Primary hypertension Assessment & Plan: Blood pressure elevated initially in office, did remain elevated on recheck, however improved.  He continues with amlodipine and valsartan , denies any issues with medications.  Prior labs were generally reassuring, however slightly elevated glucose.  Labs at that time were not fasting. We discussed options today and we can proceed with increase in dose of valsartan , continue with amlodipine at present dose. Will plan for close follow-up in about 6 to 8 weeks to complete physical and to follow-up on blood pressure, or sooner as needed. Recommend intermittent monitoring of blood pressure at home, DASH diet.  He plans to bring his home blood pressure cuff to office for comparison with ours   Wellness examination -     CBC with Differential/Platelet; Future -     Comprehensive metabolic panel with GFR; Future -     Hemoglobin A1c; Future -     Lipid panel; Future -     TSH Rfx on Abnormal to Free T4; Future  Other orders -     Valsartan ; Take 1 tablet (160 mg total) by mouth daily.  Dispense: 90 tablet; Refill: 1  Did review pathology results from recent colonoscopy.  Biopsy did indicate tubular adenoma.  Discussed that oftentimes with this, GI will likely want to have colonoscopy arranged for sooner follow-up than 10 years, however recommend awaiting recommendations from GI regarding next colonoscopy  Regarding recent elevated blood glucose on labs, discussed that labs are not fasting and this could explain the slight elevation seen.  Discussed potential for underlying prediabetes.  We  will plan to check A1c with upcoming labs for physical for further assessment  Return in about 2 months (around 05/25/2024) for CPE with fasting labs 1 week prior.   ___________________________________________ Gila Lauf de [place], MD, ABFM, CAQSM Primary Care and Sports Medicine [facility]

## 2024-03-25 NOTE — Patient Instructions (Signed)
  Medication Instructions:  Your physician recommends that you continue on your current medications as directed. Please refer to the Current Medication list given to you today. --If you need a refill on any your medications before your next appointment, please call your pharmacy first. If no refills are authorized on file call the office.-- Lab Work: Your physician has recommended that you have lab work today: 1 week before next visit  If you have labs (blood work) drawn today and your tests are completely normal, you will receive your results via MyChart message OR a phone call from our staff.  Please ensure you check your voicemail in the event that you authorized detailed messages to be left on a delegated number. If you have any lab test that is abnormal or we need to change your treatment, we will call you to review the results.   Follow-Up: Your next appointment:   Your physician recommends that you schedule a follow-up appointment in: 6-8 weeks physical  with Dr. de [place]  You will receive a text message or e-mail with a link to a survey about your care and experience with us  today! We would greatly appreciate your feedback!   Thanks for letting us  be apart of your health journey!!  Primary Care and Sports Medicine   Dr. Court Distance Peru   We encourage you to activate your patient portal called "MyChart".  Sign up information is provided on this After Visit Summary.  MyChart is used to connect with patients for Virtual Visits (Telemedicine).  Patients are able to view lab/test results, encounter notes, upcoming appointments, etc.  Non-urgent messages can be sent to your provider as well. To learn more about what you can do with MyChart, please visit --  [email or web address].

## 2024-03-25 NOTE — Assessment & Plan Note (Signed)
 Blood pressure elevated initially in office, did remain elevated on recheck, however improved.  He continues with amlodipine and valsartan , denies any issues with medications.  Prior labs were generally reassuring, however slightly elevated glucose.  Labs at that time were not fasting. We discussed options today and we can proceed with increase in dose of valsartan , continue with amlodipine at present dose. Will plan for close follow-up in about 6 to 8 weeks to complete physical and to follow-up on blood pressure, or sooner as needed. Recommend intermittent monitoring of blood pressure at home, DASH diet.  He plans to bring his home blood pressure cuff to office for comparison with ours

## 2024-03-29 ENCOUNTER — Telehealth: Payer: Self-pay | Admitting: Pediatrics

## 2024-03-29 NOTE — Telephone Encounter (Signed)
 Called and spoke with patient regarding pathology results as outlined below. Patient requested that I mail results to him, he confirmed address on file. Patient verbalized understanding and had no concerns at the end of the call.

## 2024-03-29 NOTE — Telephone Encounter (Signed)
 The polyp removed was a tubular adenoma.  While tubular adenomas are a benign type of polyp, they are considered precancerous in nature.  That means that this type of polyp could have turned into colon cancer had it not been removed.   Biopsies performed of the tissue in your rectum that appeared abnormal were benign.  They only showed evidence of hyperplastic changes.   There was no evidence of polyp tissue or precancerous changes.  This is good news.   Based upon this finding, I recommend that you undergo your next surveillance colonoscopy in 7 years or sooner should symptoms warrant.

## 2024-03-29 NOTE — Telephone Encounter (Signed)
 Patient called and stated that he would like either Dr. Yvone Herd or her nurse to call him back regarding going over his procedure report and pathology. Please advise.

## 2024-05-20 ENCOUNTER — Other Ambulatory Visit (HOSPITAL_BASED_OUTPATIENT_CLINIC_OR_DEPARTMENT_OTHER): Payer: Self-pay | Admitting: Family Medicine

## 2024-07-14 ENCOUNTER — Encounter (HOSPITAL_BASED_OUTPATIENT_CLINIC_OR_DEPARTMENT_OTHER): Payer: Self-pay | Admitting: Family Medicine

## 2024-07-14 ENCOUNTER — Ambulatory Visit (INDEPENDENT_AMBULATORY_CARE_PROVIDER_SITE_OTHER): Admitting: Family Medicine

## 2024-07-14 VITALS — BP 159/101 | HR 51 | Ht 69.0 in | Wt 242.0 lb

## 2024-07-14 DIAGNOSIS — Z0000 Encounter for general adult medical examination without abnormal findings: Secondary | ICD-10-CM | POA: Insufficient documentation

## 2024-07-14 DIAGNOSIS — I10 Essential (primary) hypertension: Secondary | ICD-10-CM | POA: Diagnosis not present

## 2024-07-14 MED ORDER — AMLODIPINE BESYLATE 10 MG PO TABS: 10.0000 mg | ORAL_TABLET | Freq: Every day | ORAL | 1 refills | Status: DC

## 2024-07-14 MED ORDER — VALSARTAN 160 MG PO TABS: 160.0000 mg | ORAL_TABLET | Freq: Every day | ORAL | 1 refills | Status: DC

## 2024-07-14 NOTE — Assessment & Plan Note (Signed)
 Routine HCM labs ordered. HCM reviewed/discussed. Anticipatory guidance regarding healthy weight, lifestyle and choices given. Recommend healthy diet.  Recommend approximately 150 minutes/week of moderate intensity exercise Recommend regular dental and vision exams Always use seatbelt/lap and shoulder restraints Recommend using smoke alarms and checking batteries at least twice a year Recommend using sunscreen when outside Discussed colon cancer screening recommendations, options.  Patient is UTD Discussed immunization recommendations

## 2024-07-14 NOTE — Progress Notes (Signed)
 Subjective:    CC: Annual Physical Exam  HPI: Eugene Knight is a 48 y.o. presenting for annual physical  I reviewed the past medical history, family history, social history, surgical history, and allergies today and no changes were needed.  Please see the problem list section below in epic for further details.  Past Medical History: Past Medical History:  Diagnosis Date   At risk for sleep apnea HAS SLEEP STUDY AS CHILD--  S/P  TONSILLECTOMY   STOP-BANG= 4   SENT TO PCP  04-11-2014   Chronic kidney disease    Stones   GERD (gastroesophageal reflux disease)    Heart murmur    As a child   History of hyperthyroidism    APPROX.  2003  (>10 YRS AGO)  S/P RADIACTIVE IODINE TX   HTN (hypertension)    Hypertension    S/P radioactive iodine thyroid  ablation    2003   Testis mass    LEFT   Thyroid  disease    PT STATES ISSUE RESOLVED   Past Surgical History: Past Surgical History:  Procedure Laterality Date   ORCHIECTOMY Left 04/15/2014   Procedure: ORCHIECTOMY;  Surgeon: Morene LELON Salines, MD;  [facility]: [facility];  Service: Urology;  Laterality: Left;   TONSILLECTOMY  AS CHILD   WISDOM TOOTH EXTRACTION  2013   Social History: Social History   Socioeconomic History   Marital status: Married    Spouse name: Not on file   Number of children: Not on file   Years of education: Not on file   Highest education level: Not on file  Occupational History   Not on file  Tobacco Use   Smoking status: Some Days    Types: Cigars    Passive exposure: Current   Smokeless tobacco: Never   Tobacco comments:    OCCASIONAL CIGAR  Vaping Use   Vaping status: Never Used  Substance and Sexual Activity   Alcohol use: Yes    Comment: OCCASIONAL   Drug use: No   Sexual activity: Not on file  Other Topics Concern   Not on file  Social History Narrative   Not on file   Social Drivers of Health   Financial Resource Strain: Not on file  Food Insecurity: Not on file   Transportation Needs: Not on file  Physical Activity: Not on file  Stress: Not on file  Social Connections: Not on file   Family History: Family History  Problem Relation Age of Onset   Colon cancer Maternal Uncle    Colon polyps Neg Hx    Esophageal cancer Neg Hx    Rectal cancer Neg Hx    Stomach cancer Neg Hx    Allergies: No Known Allergies Medications: See med rec.  Review of Systems: No headache, visual changes, nausea, vomiting, diarrhea, constipation, dizziness, abdominal pain, skin rash, fevers, chills, night sweats, swollen lymph nodes, weight loss, chest pain, body aches, joint swelling, muscle aches, shortness of breath, mood changes, visual or auditory hallucinations.  Objective:    BP (!) 159/101 (BP Location: Right Arm, Patient Position: Sitting, Cuff Size: Large)   Pulse (!) 51   Ht 5' 9 (1.753 m)   Wt 242 lb (109.8 kg)   SpO2 96%   BMI 35.74 kg/m   General: Well Developed, well nourished, and in no acute distress.  Neuro: Alert and oriented x3, extra-ocular muscles intact, sensation grossly intact. Cranial nerves II through XII are intact, motor, sensory, and coordinative functions are all  intact. HEENT: Normocephalic, atraumatic, pupils equal round reactive to light, neck supple, no masses, no lymphadenopathy, thyroid  nonpalpable. Oropharynx, nasopharynx, external ear canals are unremarkable. Skin: Warm and dry, no rashes noted.  Cardiac: Regular rate and rhythm, no murmurs rubs or gallops.  Respiratory: Clear to auscultation bilaterally. Not using accessory muscles, speaking in full sentences.  Abdominal: Soft, nontender, nondistended, positive bowel sounds, no masses, no organomegaly.  Musculoskeletal: Shoulder, elbow, wrist, hip, knee, ankle stable, and with full range of motion.  Impression and Recommendations:    Wellness examination Assessment & Plan: Routine HCM labs ordered. HCM reviewed/discussed. Anticipatory guidance regarding healthy weight,  lifestyle and choices given. Recommend healthy diet.  Recommend approximately 150 minutes/week of moderate intensity exercise Recommend regular dental and vision exams Always use seatbelt/lap and shoulder restraints Recommend using smoke alarms and checking batteries at least twice a year Recommend using sunscreen when outside Discussed colon cancer screening recommendations, options.  Patient is UTD Discussed immunization recommendations  Orders: -     TSH Rfx on Abnormal to Free T4 -     Lipid panel -     Comprehensive metabolic panel with GFR -     Hemoglobin A1c -     CBC with Differential/Platelet  Primary hypertension Assessment & Plan: Blood pressure is elevated in office.  On discussion, patient had some confusion as to what dosage to be taking for blood pressure medication.  On chart review, there are 2 prescriptions listed, 1 for 80 mg dose of valsartan  and another for 160 mg dose.  He thinks that he has been taking 80 mg dose.  Uncertain as to why prescription for 80 mg was made available.  On reviewing note from last office visit, we had like to increase patient to the 160 mg dose.  We again discussed this today.  He will resume with 160 mg and we will monitor response to this. Recommend intermittent monitoring of blood pressure at home, DASH diet.  He can bring his home blood pressure cuff to next appointment for us  to compare here in the office   Other orders -     Valsartan ; Take 1 tablet (160 mg total) by mouth daily.  Dispense: 90 tablet; Refill: 1 -     amLODIPine  Besylate; Take 1 tablet (10 mg total) by mouth daily.  Dispense: 90 tablet; Refill: 1  Return in about 2 months (around 09/13/2024) for hypertension.   ___________________________________________ Babs Dabbs de [place], MD, ABFM, CAQSM Primary Care and Sports Medicine [facility]

## 2024-07-14 NOTE — Patient Instructions (Signed)
  Medication Instructions:  Your physician recommends that you continue on your current medications as directed. Please refer to the Current Medication list given to you today. --If you need a refill on any your medications before your next appointment, please call your pharmacy first. If no refills are authorized on file call the office.-- Lab Work: Your physician has recommended that you have lab work today: today If you have labs (blood work) drawn today and your tests are completely normal, you will receive your results via MyChart message OR a phone call from our staff.  Please ensure you check your voicemail in the event that you authorized detailed messages to be left on a delegated number. If you have any lab test that is abnormal or we need to change your treatment, we will call you to review the results.  Follow-Up: Your next appointment:   Your physician recommends that you schedule a follow-up appointment in:  2 months follow up  with Dr. de [place]  You will receive a text message or e-mail with a link to a survey about your care and experience with [place] today! We would greatly appreciate your feedback!   Thanks for letting us be apart of your health journey!!  Primary Care and Sports Medicine   Dr. Ceasar Mons [place]   We encourage you to activate your patient portal called "MyChart".  Sign up information is provided on this After Visit Summary.  MyChart is used to connect with patients for Virtual Visits (Telemedicine).  Patients are able to view lab/test results, encounter notes, upcoming appointments, etc.  Non-urgent messages can be sent to your provider as well. To learn more about what you can do with MyChart, please visit --  [email or web address].

## 2024-07-14 NOTE — Assessment & Plan Note (Signed)
 Blood pressure is elevated in office.  On discussion, patient had some confusion as to what dosage to be taking for blood pressure medication.  On chart review, there are 2 prescriptions listed, 1 for 80 mg dose of valsartan  and another for 160 mg dose.  He thinks that he has been taking 80 mg dose.  Uncertain as to why prescription for 80 mg was made available.  On reviewing note from last office visit, we had like to increase patient to the 160 mg dose.  We again discussed this today.  He will resume with 160 mg and we will monitor response to this. Recommend intermittent monitoring of blood pressure at home, DASH diet.  He can bring his home blood pressure cuff to next appointment for us  to compare here in the office

## 2024-07-15 LAB — CBC WITH DIFFERENTIAL/PLATELET
Basophils Absolute: 0 x10E3/uL (ref 0.0–0.2)
Basos: 1 %
EOS (ABSOLUTE): 0.1 x10E3/uL (ref 0.0–0.4)
Eos: 2 %
Hematocrit: 45.7 % (ref 37.5–51.0)
Hemoglobin: 15.2 g/dL (ref 13.0–17.7)
Immature Grans (Abs): 0 x10E3/uL (ref 0.0–0.1)
Immature Granulocytes: 0 %
Lymphocytes Absolute: 1.6 x10E3/uL (ref 0.7–3.1)
Lymphs: 35 %
MCH: 31.6 pg (ref 26.6–33.0)
MCHC: 33.3 g/dL (ref 31.5–35.7)
MCV: 95 fL (ref 79–97)
Monocytes Absolute: 0.8 x10E3/uL (ref 0.1–0.9)
Monocytes: 17 %
Neutrophils Absolute: 2.1 x10E3/uL (ref 1.4–7.0)
Neutrophils: 45 %
Platelets: 247 x10E3/uL (ref 150–450)
RBC: 4.81 x10E6/uL (ref 4.14–5.80)
RDW: 12.3 % (ref 11.6–15.4)
WBC: 4.7 x10E3/uL (ref 3.4–10.8)

## 2024-07-15 LAB — COMPREHENSIVE METABOLIC PANEL WITH GFR
ALT: 23 IU/L (ref 0–44)
AST: 19 IU/L (ref 0–40)
Albumin: 4.5 g/dL (ref 4.1–5.1)
Alkaline Phosphatase: 78 IU/L (ref 44–121)
BUN/Creatinine Ratio: 12 (ref 9–20)
BUN: 13 mg/dL (ref 6–24)
Bilirubin Total: 0.6 mg/dL (ref 0.0–1.2)
CO2: 22 mmol/L (ref 20–29)
Calcium: 8.7 mg/dL (ref 8.7–10.2)
Chloride: 99 mmol/L (ref 96–106)
Creatinine, Ser: 1.06 mg/dL (ref 0.76–1.27)
Globulin, Total: 3.8 g/dL (ref 1.5–4.5)
Glucose: 146 mg/dL — ABNORMAL HIGH (ref 70–99)
Potassium: 4.9 mmol/L (ref 3.5–5.2)
Sodium: 136 mmol/L (ref 134–144)
Total Protein: 8.3 g/dL (ref 6.0–8.5)
eGFR: 88 mL/min/1.73 (ref >59)

## 2024-07-15 LAB — LIPID PANEL
Chol/HDL Ratio: 4.6 ratio (ref 0.0–5.0)
Cholesterol, Total: 206 mg/dL — ABNORMAL HIGH (ref 100–199)
HDL: 45 mg/dL
LDL Chol Calc (NIH): 152 mg/dL — ABNORMAL HIGH (ref 0–99)
Triglycerides: 52 mg/dL (ref 0–149)
VLDL Cholesterol Cal: 9 mg/dL (ref 5–40)

## 2024-07-15 LAB — TSH RFX ON ABNORMAL TO FREE T4: TSH: 3.3 u[IU]/mL (ref 0.450–4.500)

## 2024-07-15 LAB — HEMOGLOBIN A1C
Est. average glucose Bld gHb Est-mCnc: 177 mg/dL
Hgb A1c MFr Bld: 7.8 % — ABNORMAL HIGH (ref 4.8–5.6)

## 2024-07-21 ENCOUNTER — Ambulatory Visit (HOSPITAL_BASED_OUTPATIENT_CLINIC_OR_DEPARTMENT_OTHER): Payer: Self-pay | Admitting: Family Medicine

## 2024-09-16 ENCOUNTER — Encounter (HOSPITAL_BASED_OUTPATIENT_CLINIC_OR_DEPARTMENT_OTHER): Payer: Self-pay | Admitting: Family Medicine

## 2024-09-16 ENCOUNTER — Ambulatory Visit (HOSPITAL_BASED_OUTPATIENT_CLINIC_OR_DEPARTMENT_OTHER): Admitting: Family Medicine

## 2024-09-16 VITALS — BP 198/123 | HR 49 | Temp 97.7°F | Resp 18 | Ht 69.0 in | Wt 247.0 lb

## 2024-09-16 DIAGNOSIS — E1165 Type 2 diabetes mellitus with hyperglycemia: Secondary | ICD-10-CM

## 2024-09-16 DIAGNOSIS — E119 Type 2 diabetes mellitus without complications: Secondary | ICD-10-CM | POA: Insufficient documentation

## 2024-09-16 DIAGNOSIS — I10 Essential (primary) hypertension: Secondary | ICD-10-CM

## 2024-09-16 MED ORDER — VALSARTAN 160 MG PO TABS: 160.0000 mg | ORAL_TABLET | Freq: Every day | ORAL | 1 refills | Status: DC

## 2024-09-16 MED ORDER — AMLODIPINE BESYLATE 10 MG PO TABS: 10.0000 mg | ORAL_TABLET | Freq: Every day | ORAL | 1 refills | Status: AC

## 2024-09-16 NOTE — Assessment & Plan Note (Addendum)
 New diagnosis for patient.  Long discussion today regarding background of condition, management goals, importance of maintaining appropriate control of blood sugars, potential complications and how blood sugar control plays a role in mitigating risk for these complications.  Also discussed risk of disability related to these complications or possible premature death. We reviewed management considerations including treatment options.  Treatment options range from conservative measures/lifestyle modifications which will be important to include with any treatment plan.  We also discussed medications which can be utilized including both oral and injectable options.  He would prefer to avoid injectable medications. He prefers to focus primarily on lifestyle modifications for now. Likely would proceed with metformin if A1c remains above goal. We will check urine microalbumin/creatinine ratio at next visit for initial screening Plan to complete foot exam at future office visit

## 2024-09-16 NOTE — Assessment & Plan Note (Addendum)
 Blood pressure notably elevated in office today unfortunately, he notes that he has not been taking amlodipine  for at least a couple weeks now.  Indicates that he stopped taking the medication and generally felt fine and so continued without it.  He does continue with valsartan .  He denies any issues with chest pain or headache today. As discussed separately, he also has new diagnosis of diabetes. We discussed considerations, discussed that elevated blood sugars could also be affecting blood pressure leading to this being elevated further.  Additionally, being without one of the blood pressure medications that was being taken previously likely is also playing a role. We reviewed considerations, recommend intermittent monitoring of blood pressure at home.  Did discuss that if blood pressure is notably elevated like it is today and if he is having any symptoms such as chest pain, severe headache, visual symptoms, would recommend presenting to the emergency department for further evaluation. Recommend for him to continue with valsartan  and to resume taking amlodipine .  He will also focus on lifestyle modifications related to diabetes as well as blood pressure. Will plan to follow-up in 3 to 4 weeks to monitor blood pressure

## 2024-09-16 NOTE — Progress Notes (Signed)
    Procedures performed today:    None.  Independent interpretation of notes and tests performed by another provider:   None.  Brief History, Exam, Impression, and Recommendations:    BP (!) 198/123 (BP Location: Left Arm, Patient Position: Sitting, Cuff Size: Large)   Pulse (!) 49   Temp 97.7 F (36.5 C) (Oral)   Resp 18   Ht 5' 9 (1.753 m)   Wt 247 lb (112 kg)   SpO2 98%   BMI 36.48 kg/m   Primary hypertension Assessment & Plan: Blood pressure notably elevated in office today unfortunately, he notes that he has not been taking amlodipine  for at least a couple weeks now.  Indicates that he stopped taking the medication and generally felt fine and so continued without it.  He does continue with valsartan .  He denies any issues with chest pain or headache today. As discussed separately, he also has new diagnosis of diabetes. We discussed considerations, discussed that elevated blood sugars could also be affecting blood pressure leading to this being elevated further.  Additionally, being without one of the blood pressure medications that was being taken previously likely is also playing a role. We reviewed considerations, recommend intermittent monitoring of blood pressure at home.  Did discuss that if blood pressure is notably elevated like it is today and if he is having any symptoms such as chest pain, severe headache, visual symptoms, would recommend presenting to the emergency department for further evaluation. Recommend for him to continue with valsartan  and to resume taking amlodipine .  He will also focus on lifestyle modifications related to diabetes as well as blood pressure. Will plan to follow-up in 3 to 4 weeks to monitor blood pressure  Orders: -     amLODIPine  Besylate; Take 1 tablet (10 mg total) by mouth daily.  Dispense: 90 tablet; Refill: 1 -     Valsartan ; Take 1 tablet (160 mg total) by mouth daily.  Dispense: 90 tablet; Refill: 1  Type 2 diabetes mellitus  with hyperglycemia, without long-term current use of insulin (HCC) Assessment & Plan: New diagnosis for patient.  Long discussion today regarding background of condition, management goals, importance of maintaining appropriate control of blood sugars, potential complications and how blood sugar control plays a role in mitigating risk for these complications.  Also discussed risk of disability related to these complications or possible premature death. We reviewed management considerations including treatment options.  Treatment options range from conservative measures/lifestyle modifications which will be important to include with any treatment plan.  We also discussed medications which can be utilized including both oral and injectable options.  He would prefer to avoid injectable medications. He prefers to focus primarily on lifestyle modifications for now. Likely would proceed with metformin if A1c remains above goal. We will check urine microalbumin/creatinine ratio at next visit for initial screening Plan to complete foot exam at future office visit   Return in about 4 weeks (around 10/14/2024) for diabetes, hypertension.   ___________________________________________ Eugene Hopfensperger de [place], MD, ABFM, CAQSM Primary Care and Sports Medicine [facility]

## 2024-10-28 ENCOUNTER — Ambulatory Visit (HOSPITAL_BASED_OUTPATIENT_CLINIC_OR_DEPARTMENT_OTHER): Admitting: Family Medicine

## 2024-10-28 VITALS — BP 151/101 | HR 56 | Temp 97.6°F | Resp 18 | Ht 69.0 in | Wt 238.0 lb

## 2024-10-28 DIAGNOSIS — I10 Essential (primary) hypertension: Secondary | ICD-10-CM

## 2024-10-28 DIAGNOSIS — E1165 Type 2 diabetes mellitus with hyperglycemia: Secondary | ICD-10-CM

## 2024-10-28 MED ORDER — VALSARTAN 320 MG PO TABS: 320.0000 mg | ORAL_TABLET | Freq: Every day | ORAL | 1 refills | Status: AC

## 2024-10-28 NOTE — Assessment & Plan Note (Addendum)
 Patient continues with focusing on lifestyle modifications.  No new issues such as polyuria or polydipsia. We previously reviewed risk related to diabetes, need for optimal blood sugar control, discussed medication options.  He ultimately would prefer to avoid injectable medications initially. Patient will continue with lifestyle modifications.  Will plan to recheck A1c at time of next office visit.  Consider metformin if A1c remains above goal. We will check urine ACR with next labs, he will be returning in about 2 weeks to have labs completed Plan to complete foot exam at future office visit At future visit, will also need to review concerns related to hyperlipidemia, recommendation for statin therapy

## 2024-10-28 NOTE — Assessment & Plan Note (Signed)
 Blood pressure elevated today, however has improved since last visit.  He was checking blood pressure at home, however batteries died in his blood pressure cuff.  When he was checking this, blood pressure would typically be around 150 systolic, 90 diastolic.  No current issues with chest pain or headaches. He has been taking valsartan  and amlodipine .  No current issues with medications. We discussed considerations, given elevated readings at home as well as in the office with current regimen, we discussed consideration for changes in pharmacotherapy.  We also discussed lifestyle modifications. He is amenable to medication changes today, we will increase dose of valsartan  from 160 mg to 320 mg.  We will continue with amlodipine  at current dose.  He will return for BMP in 1 to 2 weeks. Recommend intermittent monitoring of blood pressure at home, DASH diet

## 2024-10-28 NOTE — Progress Notes (Signed)
° ° °  Procedures performed today:    None.  Independent interpretation of notes and tests performed by another provider:   None.  Brief History, Exam, Impression, and Recommendations:    BP (!) 151/101 (BP Location: Left Arm, Patient Position: Sitting, Cuff Size: Large)   Pulse (!) 56   Temp 97.6 F (36.4 C) (Oral)   Resp 18   Ht 5' 9 (1.753 m)   Wt 238 lb (108 kg)   SpO2 100%   BMI 35.15 kg/m   Type 2 diabetes mellitus with hyperglycemia, without long-term current use of insulin (HCC) Assessment & Plan: Patient continues with focusing on lifestyle modifications.  No new issues such as polyuria or polydipsia. We previously reviewed risk related to diabetes, need for optimal blood sugar control, discussed medication options.  He ultimately would prefer to avoid injectable medications initially. Patient will continue with lifestyle modifications.  Will plan to recheck A1c at time of next office visit.  Consider metformin if A1c remains above goal. We will check urine ACR with next labs, he will be returning in about 2 weeks to have labs completed Plan to complete foot exam at future office visit At future visit, will also need to review concerns related to hyperlipidemia, recommendation for statin therapy  Orders: -     Microalbumin / creatinine urine ratio  Primary hypertension Assessment & Plan: Blood pressure elevated today, however has improved since last visit.  He was checking blood pressure at home, however batteries died in his blood pressure cuff.  When he was checking this, blood pressure would typically be around 150 systolic, 90 diastolic.  No current issues with chest pain or headaches. He has been taking valsartan  and amlodipine .  No current issues with medications. We discussed considerations, given elevated readings at home as well as in the office with current regimen, we discussed consideration for changes in pharmacotherapy.  We also discussed lifestyle  modifications. He is amenable to medication changes today, we will increase dose of valsartan  from 160 mg to 320 mg.  We will continue with amlodipine  at current dose.  He will return for BMP in 1 to 2 weeks. Recommend intermittent monitoring of blood pressure at home, DASH diet  Orders: -     Valsartan ; Take 1 tablet (320 mg total) by mouth daily.  Dispense: 90 tablet; Refill: 1 -     Basic metabolic panel with GFR  Return in about 6 weeks (around 12/09/2024) for diabetes, hypertension.   ___________________________________________ Imaya Duffy de [place], MD, ABFM, CAQSM [facility]

## 2024-11-12 ENCOUNTER — Ambulatory Visit (HOSPITAL_BASED_OUTPATIENT_CLINIC_OR_DEPARTMENT_OTHER)

## 2024-11-18 ENCOUNTER — Other Ambulatory Visit (HOSPITAL_BASED_OUTPATIENT_CLINIC_OR_DEPARTMENT_OTHER): Payer: Self-pay | Admitting: Family Medicine

## 2024-12-12 ENCOUNTER — Encounter (HOSPITAL_BASED_OUTPATIENT_CLINIC_OR_DEPARTMENT_OTHER): Payer: Self-pay | Admitting: *Deleted

## 2024-12-13 ENCOUNTER — Ambulatory Visit (HOSPITAL_BASED_OUTPATIENT_CLINIC_OR_DEPARTMENT_OTHER): Admitting: Family Medicine

## 2024-12-15 ENCOUNTER — Other Ambulatory Visit (HOSPITAL_BASED_OUTPATIENT_CLINIC_OR_DEPARTMENT_OTHER): Payer: Self-pay

## 2024-12-15 ENCOUNTER — Ambulatory Visit (HOSPITAL_BASED_OUTPATIENT_CLINIC_OR_DEPARTMENT_OTHER): Admitting: Family Medicine
